# Patient Record
Sex: Female | Born: 1955 | Race: White | Hispanic: No | Marital: Married | Smoking: Never smoker
Health system: Southern US, Community
[De-identification: ages and names within clinical notes are randomized; demographics above are authoritative.]

## PROBLEM LIST (undated history)

## (undated) DIAGNOSIS — D680 Von Willebrand's disease: Secondary | ICD-10-CM

## (undated) DIAGNOSIS — E119 Type 2 diabetes mellitus without complications: Secondary | ICD-10-CM

## (undated) DIAGNOSIS — N289 Disorder of kidney and ureter, unspecified: Secondary | ICD-10-CM

## (undated) DIAGNOSIS — I10 Essential (primary) hypertension: Secondary | ICD-10-CM

## (undated) DIAGNOSIS — C50919 Malignant neoplasm of unspecified site of unspecified female breast: Secondary | ICD-10-CM

## (undated) DIAGNOSIS — C801 Malignant (primary) neoplasm, unspecified: Secondary | ICD-10-CM

## (undated) DIAGNOSIS — H409 Unspecified glaucoma: Secondary | ICD-10-CM

## (undated) DIAGNOSIS — D68020 Von Willebrand disease, type 2a: Secondary | ICD-10-CM

## (undated) HISTORY — PX: OOPHORECTOMY: SHX86

---

## 2004-02-03 ENCOUNTER — Ambulatory Visit: Payer: Self-pay | Admitting: Family Medicine

## 2005-03-21 ENCOUNTER — Emergency Department: Payer: Self-pay | Admitting: Emergency Medicine

## 2005-04-15 ENCOUNTER — Ambulatory Visit: Payer: Self-pay | Admitting: Family Medicine

## 2006-08-30 ENCOUNTER — Ambulatory Visit: Payer: Self-pay | Admitting: Family Medicine

## 2008-04-24 ENCOUNTER — Ambulatory Visit: Payer: Self-pay | Admitting: Oncology

## 2008-05-22 ENCOUNTER — Ambulatory Visit: Payer: Self-pay | Admitting: Oncology

## 2008-05-24 ENCOUNTER — Ambulatory Visit: Payer: Self-pay | Admitting: Oncology

## 2008-05-27 ENCOUNTER — Ambulatory Visit: Payer: Self-pay | Admitting: Oncology

## 2008-06-24 ENCOUNTER — Ambulatory Visit: Payer: Self-pay | Admitting: Oncology

## 2008-07-02 ENCOUNTER — Ambulatory Visit: Payer: Self-pay | Admitting: Obstetrics and Gynecology

## 2008-07-07 ENCOUNTER — Ambulatory Visit: Payer: Self-pay | Admitting: Obstetrics and Gynecology

## 2008-07-24 ENCOUNTER — Ambulatory Visit: Payer: Self-pay | Admitting: Oncology

## 2008-08-05 ENCOUNTER — Ambulatory Visit: Payer: Self-pay | Admitting: Gastroenterology

## 2008-11-24 ENCOUNTER — Ambulatory Visit: Payer: Self-pay | Admitting: Oncology

## 2008-12-01 ENCOUNTER — Ambulatory Visit: Payer: Self-pay | Admitting: Gastroenterology

## 2008-12-16 ENCOUNTER — Ambulatory Visit: Payer: Self-pay | Admitting: Surgery

## 2008-12-17 ENCOUNTER — Ambulatory Visit: Payer: Self-pay | Admitting: Oncology

## 2008-12-22 ENCOUNTER — Ambulatory Visit: Payer: Self-pay | Admitting: Surgery

## 2008-12-24 ENCOUNTER — Ambulatory Visit: Payer: Self-pay | Admitting: Oncology

## 2009-10-02 ENCOUNTER — Ambulatory Visit: Payer: Self-pay | Admitting: Family Medicine

## 2011-03-15 ENCOUNTER — Ambulatory Visit: Payer: Self-pay | Admitting: Family Medicine

## 2011-05-25 ENCOUNTER — Ambulatory Visit: Payer: Self-pay | Admitting: Internal Medicine

## 2011-06-17 ENCOUNTER — Inpatient Hospital Stay: Payer: Self-pay | Admitting: Student

## 2011-06-17 LAB — CK TOTAL AND CKMB (NOT AT ARMC)
CK, Total: 30 U/L (ref 21–215)
CK-MB: <0.5 ng/mL — ABNORMAL LOW (ref 0.5–3.6)

## 2011-06-17 LAB — HEPATIC FUNCTION PANEL A (ARMC)
Albumin: 3.2 g/dL — ABNORMAL LOW (ref 3.4–5.0)
Alkaline Phosphatase: 76 U/L (ref 50–136)
Bilirubin, Direct: <0.1 mg/dL (ref 0.00–0.20)
Bilirubin,Total: 0.3 mg/dL (ref 0.2–1.0)

## 2011-06-17 LAB — IRON AND TIBC
Iron Bind.Cap.(Total): 463 ug/dL — ABNORMAL HIGH (ref 250–450)
Iron Saturation: 10 %
Iron: 48 ug/dL — ABNORMAL LOW (ref 50–170)
Unbound Iron-Bind.Cap.: 415 ug/dL

## 2011-06-17 LAB — TSH: Thyroid Stimulating Horm: 2.33 u[IU]/mL

## 2011-06-17 LAB — CBC
HCT: 22.7 % — ABNORMAL LOW (ref 35.0–47.0)
MCH: 22.7 pg — ABNORMAL LOW (ref 26.0–34.0)
MCHC: 29.4 g/dL — ABNORMAL LOW (ref 32.0–36.0)
MCV: 77 fL — ABNORMAL LOW (ref 80–100)
Platelet: 163 10*3/uL (ref 150–440)
RBC: 2.94 10*6/uL — ABNORMAL LOW (ref 3.80–5.20)
RDW: 17.5 % — ABNORMAL HIGH (ref 11.5–14.5)
WBC: 13.4 10*3/uL — ABNORMAL HIGH (ref 3.6–11.0)

## 2011-06-17 LAB — URINALYSIS, COMPLETE
Bacteria: NONE SEEN
Blood: NEGATIVE
Glucose,UR: NEGATIVE mg/dL (ref 0–75)
Ketone: NEGATIVE
Leukocyte Esterase: NEGATIVE
Protein: NEGATIVE
RBC,UR: NONE SEEN /HPF (ref 0–5)
Specific Gravity: 1.019 (ref 1.003–1.030)
WBC UR: NONE SEEN /HPF (ref 0–5)

## 2011-06-17 LAB — BASIC METABOLIC PANEL
Anion Gap: 10 (ref 7–16)
BUN: 16 mg/dL (ref 7–18)
Calcium, Total: 8.5 mg/dL (ref 8.5–10.1)
Creatinine: 0.82 mg/dL (ref 0.60–1.30)
Glucose: 210 mg/dL — ABNORMAL HIGH (ref 65–99)

## 2011-06-17 LAB — TROPONIN I
Troponin-I: <0.02 ng/mL
Troponin-I: <0.02 ng/mL

## 2011-06-17 LAB — APTT: Activated PTT: 32 secs (ref 23.6–35.9)

## 2011-06-17 LAB — PLATELET FUNCTION ASSAY: COL/EPI PLT FXN SCRN: >300 Seconds — ABNORMAL HIGH

## 2011-06-17 LAB — FERRITIN: Ferritin (ARMC): 4 ng/mL — ABNORMAL LOW (ref 8–388)

## 2011-06-18 LAB — CBC WITH DIFFERENTIAL/PLATELET
Basophil %: 1.2 %
Eosinophil #: 0.1 10*3/uL (ref 0.0–0.7)
Eosinophil %: 1.1 %
HCT: 25.6 % — ABNORMAL LOW (ref 35.0–47.0)
HGB: 7.7 g/dL — ABNORMAL LOW (ref 12.0–16.0)
Lymphocyte %: 20.3 %
MCH: 23.9 pg — ABNORMAL LOW (ref 26.0–34.0)
MCHC: 30.2 g/dL — ABNORMAL LOW (ref 32.0–36.0)
MCV: 79 fL — ABNORMAL LOW (ref 80–100)
Monocyte #: 0.7 x10 3/mm (ref 0.2–0.9)
Monocyte %: 5.1 %
Neutrophil #: 9.6 10*3/uL — ABNORMAL HIGH (ref 1.4–6.5)
Neutrophil %: 72.3 %
RBC: 3.23 10*6/uL — ABNORMAL LOW (ref 3.80–5.20)
RDW: 17.8 % — ABNORMAL HIGH (ref 11.5–14.5)

## 2011-06-18 LAB — TROPONIN I: Troponin-I: <0.02 ng/mL

## 2011-06-18 LAB — BASIC METABOLIC PANEL
Anion Gap: 11 (ref 7–16)
Calcium, Total: 7.9 mg/dL — ABNORMAL LOW (ref 8.5–10.1)
Co2: 25 mmol/L (ref 21–32)
EGFR (Non-African Amer.): >60
Osmolality: 281 (ref 275–301)
Potassium: 3.7 mmol/L (ref 3.5–5.1)
Sodium: 141 mmol/L (ref 136–145)

## 2011-06-18 LAB — PLATELET COUNT: Platelet: 109 10*3/uL — ABNORMAL LOW (ref 150–440)

## 2011-06-18 LAB — HEMOGLOBIN A1C: Hemoglobin A1C: 7.2 % — ABNORMAL HIGH (ref 4.2–6.3)

## 2011-06-18 LAB — CK TOTAL AND CKMB (NOT AT ARMC): CK-MB: <0.5 ng/mL — ABNORMAL LOW (ref 0.5–3.6)

## 2011-06-18 LAB — HEMOGLOBIN: HGB: 7.9 g/dL — ABNORMAL LOW (ref 12.0–16.0)

## 2011-06-19 LAB — CBC WITH DIFFERENTIAL/PLATELET
Basophil #: 0.1 10*3/uL (ref 0.0–0.1)
Basophil #: 0.1 10*3/uL (ref 0.0–0.1)
Basophil %: 0.6 %
Basophil %: 0.7 %
Eosinophil #: 0.2 10*3/uL (ref 0.0–0.7)
Eosinophil #: 0.2 10*3/uL (ref 0.0–0.7)
Eosinophil %: 1.7 %
HCT: 26.1 % — ABNORMAL LOW (ref 35.0–47.0)
HGB: 7.2 g/dL — ABNORMAL LOW (ref 12.0–16.0)
Lymphocyte #: 2.1 10*3/uL (ref 1.0–3.6)
Lymphocyte #: 2.5 10*3/uL (ref 1.0–3.6)
Lymphocyte %: 20.8 %
MCH: 25 pg — ABNORMAL LOW (ref 26.0–34.0)
MCH: 25.2 pg — ABNORMAL LOW (ref 26.0–34.0)
MCHC: 32 g/dL (ref 32.0–36.0)
MCHC: 31.5 g/dL — ABNORMAL LOW (ref 32.0–36.0)
MCV: 78 fL — ABNORMAL LOW (ref 80–100)
MCV: 80 fL (ref 80–100)
Monocyte #: 0.5 x10 3/mm (ref 0.2–0.9)
Monocyte %: 5.2 %
Monocyte %: 5.6 %
Neutrophil #: 7.2 10*3/uL — ABNORMAL HIGH (ref 1.4–6.5)
Neutrophil #: 8.6 10*3/uL — ABNORMAL HIGH (ref 1.4–6.5)
Neutrophil %: 71.1 %
Platelet: 115 10*3/uL — ABNORMAL LOW (ref 150–440)
Platelet: 103 10*3/uL — ABNORMAL LOW (ref 150–440)
RBC: 2.9 10*6/uL — ABNORMAL LOW (ref 3.80–5.20)
RBC: 3.27 10*6/uL — ABNORMAL LOW (ref 3.80–5.20)
RDW: 17.9 % — ABNORMAL HIGH (ref 11.5–14.5)
RDW: 17.8 % — ABNORMAL HIGH (ref 11.5–14.5)
WBC: 10.1 10*3/uL (ref 3.6–11.0)
WBC: 12 10*3/uL — ABNORMAL HIGH (ref 3.6–11.0)

## 2011-06-20 LAB — CBC WITH DIFFERENTIAL/PLATELET
Basophil #: 0.1 10*3/uL (ref 0.0–0.1)
Basophil %: 0.9 %
Basophil %: 0.8 %
Eosinophil #: 0.2 10*3/uL (ref 0.0–0.7)
HCT: 24.3 % — ABNORMAL LOW (ref 35.0–47.0)
HGB: 7.6 g/dL — ABNORMAL LOW (ref 12.0–16.0)
HGB: 7.6 g/dL — ABNORMAL LOW (ref 12.0–16.0)
Lymphocyte #: 2.2 10*3/uL (ref 1.0–3.6)
Lymphocyte #: 2.4 10*3/uL (ref 1.0–3.6)
Lymphocyte %: 18.8 %
Lymphocyte %: 23.9 %
MCH: 25 pg — ABNORMAL LOW (ref 26.0–34.0)
MCHC: 31.9 g/dL — ABNORMAL LOW (ref 32.0–36.0)
MCV: 80 fL (ref 80–100)
MCV: 80 fL (ref 80–100)
Monocyte #: 0.6 x10 3/mm (ref 0.2–0.9)
Monocyte %: 4.9 %
Monocyte %: 4.2 %
Neutrophil #: 8.6 10*3/uL — ABNORMAL HIGH (ref 1.4–6.5)
Neutrophil %: 73.4 %
Neutrophil %: 69.1 %
RBC: 2.96 10*6/uL — ABNORMAL LOW (ref 3.80–5.20)
RDW: 18.2 % — ABNORMAL HIGH (ref 11.5–14.5)
RDW: 18.3 % — ABNORMAL HIGH (ref 11.5–14.5)

## 2011-06-20 LAB — COMPREHENSIVE METABOLIC PANEL
Alkaline Phosphatase: 58 U/L (ref 50–136)
Anion Gap: 9 (ref 7–16)
BUN: 8 mg/dL (ref 7–18)
Bilirubin,Total: 0.5 mg/dL (ref 0.2–1.0)
Creatinine: 0.58 mg/dL — ABNORMAL LOW (ref 0.60–1.30)
EGFR (African American): >60
EGFR (Non-African Amer.): >60
Osmolality: 281 (ref 275–301)
SGOT(AST): 22 U/L (ref 15–37)

## 2011-06-21 LAB — HEMOGLOBIN
HGB: 8.3 g/dL — ABNORMAL LOW (ref 12.0–16.0)
HGB: 8.5 g/dL — ABNORMAL LOW (ref 12.0–16.0)

## 2011-06-21 LAB — CBC WITH DIFFERENTIAL/PLATELET
Eosinophil #: 0.2 10*3/uL (ref 0.0–0.7)
HCT: 26.8 % — ABNORMAL LOW (ref 35.0–47.0)
HGB: 8.6 g/dL — ABNORMAL LOW (ref 12.0–16.0)
Lymphocyte #: 2.2 10*3/uL (ref 1.0–3.6)
Lymphocyte %: 20.9 %
MCHC: 31.9 g/dL — ABNORMAL LOW (ref 32.0–36.0)
MCV: 81 fL (ref 80–100)
Monocyte #: 0.6 x10 3/mm (ref 0.2–0.9)
Monocyte %: 5.6 %
Neutrophil #: 7.2 10*3/uL — ABNORMAL HIGH (ref 1.4–6.5)
Neutrophil %: 70 %
Platelet: 108 10*3/uL — ABNORMAL LOW (ref 150–440)
RBC: 3.31 10*6/uL — ABNORMAL LOW (ref 3.80–5.20)
RDW: 18.6 % — ABNORMAL HIGH (ref 11.5–14.5)
WBC: 10.3 10*3/uL (ref 3.6–11.0)

## 2011-06-21 LAB — BASIC METABOLIC PANEL
Anion Gap: 9 (ref 7–16)
BUN: 5 mg/dL — ABNORMAL LOW (ref 7–18)
Calcium, Total: 8 mg/dL — ABNORMAL LOW (ref 8.5–10.1)
Chloride: 106 mmol/L (ref 98–107)
Co2: 26 mmol/L (ref 21–32)
EGFR (Non-African Amer.): >60
Glucose: 172 mg/dL — ABNORMAL HIGH (ref 65–99)
Osmolality: 283 (ref 275–301)
Potassium: 3.5 mmol/L (ref 3.5–5.1)
Sodium: 141 mmol/L (ref 136–145)

## 2011-06-21 LAB — PLATELET COUNT: Platelet: 110 10*3/uL — ABNORMAL LOW (ref 150–440)

## 2011-06-23 LAB — PATHOLOGY REPORT

## 2011-06-25 ENCOUNTER — Ambulatory Visit: Payer: Self-pay | Admitting: Internal Medicine

## 2012-04-04 ENCOUNTER — Emergency Department: Payer: Self-pay | Admitting: Emergency Medicine

## 2012-04-04 LAB — COMPREHENSIVE METABOLIC PANEL
Albumin: 3.6 g/dL (ref 3.4–5.0)
Alkaline Phosphatase: 98 U/L (ref 50–136)
Anion Gap: 9 (ref 7–16)
BUN: 14 mg/dL (ref 7–18)
Bilirubin,Total: 0.4 mg/dL (ref 0.2–1.0)
Calcium, Total: 9 mg/dL (ref 8.5–10.1)
Co2: 25 mmol/L (ref 21–32)
Creatinine: 0.72 mg/dL (ref 0.60–1.30)
EGFR (Non-African Amer.): >60
Glucose: 237 mg/dL — ABNORMAL HIGH (ref 65–99)
Osmolality: 282 (ref 275–301)
SGOT(AST): 75 U/L — ABNORMAL HIGH (ref 15–37)
Total Protein: 8.3 g/dL — ABNORMAL HIGH (ref 6.4–8.2)

## 2012-04-04 LAB — CBC WITH DIFFERENTIAL/PLATELET
Basophil #: 0.1 10*3/uL (ref 0.0–0.1)
Eosinophil #: 0.1 10*3/uL (ref 0.0–0.7)
HGB: 14.6 g/dL (ref 12.0–16.0)
Lymphocyte #: 1.5 10*3/uL (ref 1.0–3.6)
MCH: 29.1 pg (ref 26.0–34.0)
MCHC: 32.8 g/dL (ref 32.0–36.0)
Monocyte %: 3 %
RDW: 14.7 % — ABNORMAL HIGH (ref 11.5–14.5)

## 2012-05-07 ENCOUNTER — Ambulatory Visit: Payer: Self-pay | Admitting: Family Medicine

## 2013-03-01 ENCOUNTER — Ambulatory Visit: Payer: Self-pay | Admitting: Otolaryngology

## 2013-05-31 ENCOUNTER — Ambulatory Visit: Payer: Self-pay | Admitting: Family Medicine

## 2013-06-13 ENCOUNTER — Ambulatory Visit: Payer: Self-pay | Admitting: Family Medicine

## 2013-10-16 ENCOUNTER — Emergency Department: Payer: Self-pay | Admitting: Emergency Medicine

## 2013-10-16 LAB — TROPONIN I

## 2013-10-16 LAB — BASIC METABOLIC PANEL
ANION GAP: 8 (ref 7–16)
BUN: 22 mg/dL — AB (ref 7–18)
CHLORIDE: 104 mmol/L (ref 98–107)
CREATININE: 0.98 mg/dL (ref 0.60–1.30)
Calcium, Total: 9.1 mg/dL (ref 8.5–10.1)
Co2: 22 mmol/L (ref 21–32)
EGFR (African American): >60
EGFR (Non-African Amer.): >60
GLUCOSE: 441 mg/dL — AB (ref 65–99)
Osmolality: 291 (ref 275–301)
POTASSIUM: 4.1 mmol/L (ref 3.5–5.1)
Sodium: 134 mmol/L — ABNORMAL LOW (ref 136–145)

## 2013-10-16 LAB — CBC
HCT: 42.1 % (ref 35.0–47.0)
HGB: 13.4 g/dL (ref 12.0–16.0)
MCH: 27.7 pg (ref 26.0–34.0)
MCHC: 31.8 g/dL — ABNORMAL LOW (ref 32.0–36.0)
MCV: 87 fL (ref 80–100)
PLATELETS: 167 10*3/uL (ref 150–440)
RBC: 4.83 10*6/uL (ref 3.80–5.20)
RDW: 17.1 % — AB (ref 11.5–14.5)
WBC: 10.2 10*3/uL (ref 3.6–11.0)

## 2013-10-16 LAB — D-DIMER(ARMC): D-Dimer: 447 ng/ml

## 2014-05-18 NOTE — Consult Note (Signed)
Pt had capsule study today.  To be downloaded into computer and i will get a chance to read it tomorrow morning.  Started full liquid diet now.  Electronic Signatures: Manya Silvas (MD)  (Signed on 28-May-13 17:43)  Authored  Last Updated: 28-May-13 17:43 by Manya Silvas (MD)

## 2014-05-18 NOTE — Consult Note (Signed)
Pt colonoscopy done to terminal ileum, there was blood throughout, indicating a source proximal to terminal ileum.  Case discussed with Dr. Cynda Acres. Will give 2 qts Miralax tonight and to have capsule endo tomorrow.  She may be transfered to [facility] due to VWD per Dr. Cynda Acres.   Electronic Signatures: Manya Silvas (MD)  (Signed on 27-May-13 13:27)  Authored  Last Updated: 27-May-13 13:27 by Manya Silvas (MD)

## 2014-05-18 NOTE — Consult Note (Signed)
Chief Complaint:   Subjective/Chief Complaint Feels better after blood transfusion. Looks less pale.   VITAL SIGNS/ANCILLARY NOTES: **Vital Signs.:   25-May-13 09:55   Vital Signs Type Q 4hr   Systolic BP Systolic BP 197   Diastolic BP (mmHg) Diastolic BP (mmHg) 74   Mean BP 87   BP Source standing   Brief Assessment:   Cardiac Regular    Respiratory clear BS    Gastrointestinal Normal   Routine Hem:  25-May-13 09:34    Hemoglobin (CBC) 7.5   Assessment/Plan:  Assessment/Plan:   Assessment vWD with anemia. No active bleeding right now.    Plan Await hematology input. NPO after MN except meds in case we do EGD tomorrow. Thanks.   Electronic Signatures: Verdie Shire (MD)  (Signed [phone] 11:03)  Authored: Chief Complaint, VITAL SIGNS/ANCILLARY NOTES, Brief Assessment, Lab Results, Assessment/Plan   Last Updated: 25-May-13 11:03 by Verdie Shire (MD)

## 2014-05-18 NOTE — Consult Note (Signed)
Chief Complaint:   Subjective/Chief Complaint NO ACUTE COMPLAINTS.  blood in bm this am before colonoscopoy, no bm since   VITAL SIGNS/ANCILLARY NOTES: **Vital Signs.:   27-May-13 22:21   Vital Signs Type Routine   Temperature Temperature (F) 98.1   Celsius 36.7   Temperature Source oral   Pulse Pulse 57   Pulse source per Dinamap   Respirations Respirations 18   Systolic BP Systolic BP 93   Diastolic BP (mmHg) Diastolic BP (mmHg) 66   Mean BP 75   BP Source Dinamap   Pulse Ox % Pulse Ox % 99   Pulse Ox Activity Level  At rest   Oxygen Delivery Room Air/ 21 %   Brief Assessment:   Cardiac Regular    Additional Physical Exam ALERT AND COOPERATIVE, SLIGHT PALLOR, NO DISTRESS, NEURO NON FOCAL, ABDO NON TENDER, PIC LINE SITE NON TENDER, NO OOZING   Routine Hem:  27-May-13 16:58    WBC (CBC) 10.0   RBC (CBC) 2.96   Hemoglobin (CBC) 7.6   Hematocrit (CBC) 23.8   Platelet Count (CBC) 114   MCV 80   MCH 25.7   MCHC 31.9   RDW 18.3  Routine Chem:  27-May-13 16:58    Glucose, Serum 95   BUN 8   Creatinine (comp) 0.58   Sodium, Serum 142   Potassium, Serum 3.7   Chloride, Serum 105   CO2, Serum 28   Calcium (Total), Serum 8.1   Anion Gap 9   Osmolality (calc) 281   eGFR (African American) >60   eGFR (Non-African American) >60  Hepatic:  27-May-13 16:58    Bilirubin, Total 0.5   Alkaline Phosphatase 58   SGPT (ALT) 15   SGOT (AST) 22   Total Protein, Serum 6.1   Albumin, Serum 2.7  Routine Hem:  27-May-13 16:58    Neutrophil % 69.1   Lymphocyte % 23.9   Monocyte % 4.2   Eosinophil % 2.0   Basophil % 0.8   Neutrophil # 6.9   Lymphocyte # 2.4   Monocyte # 0.4   Eosinophil # 0.2   Basophil # 0.1    22:59    Hemoglobin (CBC) 8.4   Radiology Results: XRay:    27-May-13 16:24, Chest Portable Single View   Chest Portable Single View    REASON FOR EXAM:    to check placement of PICC line  COMMENTS:       PROCEDURE: DXR - DXR PORTABLE CHEST SINGLE VIEW  -  Jun 20 2011  4:24PM     RESULT: Comparison is made to the prior exam of 06/17/2011.     The lung fields are clear. No pneumonia, pneumothorax or pleural effusion   is seen. The heart size is normal. A PICC line is present with the tip   projected over the upper portion of the right atrium. No pneumothorax or   pleural effusion is seen.    IMPRESSION:     1.  No acute changes are identified.  2.  A PICC line is present with the tip projected over the upper portion   of the right atrium.      Dictation Site: 2     Thank you for this opportunity to contribute to the care of your patient.         Verified By: Dionne Ano WALL, M.D., MD  Cardiology:    25-May-13 12:48, Echo Doppler   Echo Doppler    Interpretation Summary  The left ventricle is grossly normal size. There is no thrombus. Left   ventricular systolic function is normal. Ejection Fraction = >55%.   There is normal left ventricular wall thickness. The left   ventricular wall motion is normal. The right ventricular systolic   function is normal.    PatientHeight: 168 cm    PatientWeight: 95 kg    BSA: 2.0 m2    Procedure:    A two-dimensional transthoracic echocardiogram with color flow and   Doppler was performed.    The study was completed  bedside.    Left Ventricle    Left ventricular systolic function is normal.    Ejection Fraction = >55%.    The left ventricular wall motion is normal.    The left ventricle is grossly normal size.    There is no thrombus.    There isnormal left ventricular wall thickness.    Right Ventricle    The right ventricle is grossly normal size.    There is normal right ventricular wall thickness.    The right ventricular systolic function is normal.    Atria    The left atrial size is normal.    Right atrial size is normal.    Mitral Valve    The mitral valve leaflets appear thickened, but open well.    There is no mitral regurgitation noted.    Tricuspid Valve    The  tricuspid valve is not well visualized, but is grossly normal.  There is trace tricuspid regurgitation.    Aortic Valve    The aortic valve is normal in structure and function.    No aortic regurgitation is present.    Pulmonic Valve    The pulmonic valve is not well seen, but is grossly normal.    There is no pulmonic valvular regurgitation.    Great Vessels    The aortic root is not well visualized but is probably normal size.    Pericardium/Pleural    There is no pleural effusion.    No pericardial effusion.    MMode 2D Measurements and Calculations    IVSd: 1.1 cm    LVIDd: 5.2 cm    LVIDs: 3.2 cm    LVPWd: 1.2 cm    FS: 40 %    EF(Teich): 70 %    Ao root diam: 3.3 cm    ACS: 2.0 cm    LA dimension: 4.4 cm    LVOT diam: 1.9 cm    LVLd ap4: 9.3 cm    EDV(MOD-sp4): 137 ml    LVLs ap4: 7.5 cm    ESV(MOD-sp4): 57 ml    EF(MOD-sp4): 58 %    SV(MOD-sp4): 80 ml    Doppler Measurements and Calculations    MV E point: 123 cm/sec    MV A point: 144 cm/sec    MV E/A: 0.85     MV P1/2t max vel: 121 cm/sec    MV P1/2t: 78 msec    MVA(P1/2t): 2.8 cm2    MV dec slope: 454 cm/sec2    MV dec time: 0.25 sec    Ao V2 max: 224 cm/sec    Ao max PG: 21 mmHg    Ao V2 mean: 113 cm/sec    Ao mean PG: 6.3 mmHg    Ao V2 VTI: 41 cm    AVA(I,D): 2.2 cm2    AVA(V,D): 1.9 cm2    LV max PG: 9.0 mmHg    LV mean PG: 4.0 mmHg  LV V1 max: 153 cm/sec    LV V1 mean: 91 cm/sec    LV V1 VTI: 31 cm    SV(LVOT): 89 ml    PA V2 max: 133 cm/sec    PA max PG: 7.0 mmHg    PA acc time: 0.11 sec    PI end-d vel: 46 cm/sec    TR Max vel: 241 cm/sec    TR Max PG: 24 mmHg    RVSP: 29 mmHg    RAP systole: 5.0 mmHg    PA pr(Accel): 31 mmHg    Reading Physician: Lujean Amel   Sonographer: Jaci Standard  Interpreting Physician:  Lujean Amel,  electronically signed on   06-19-2011 18:25:49  Requesting Physician: Bartholome Bill   Assessment/Plan:  Invasive Device Daily Assessment of  Necessity:   Does the patient currently have any of the following indwelling devices? central line catheter    Central Line continued, requirement due to    Reason to continue [employer] for frequent venous blood sampling   Assessment/Plan:   Assessment VWD. RESULTS FROM ADMISSION  VIII C  NORMAL,  VIII AG 67 %,  VON WILLEBRANDS ACTIVITY 50 %. NOTE PRIOR REPORTED PLT FUNCTION ABNORMAL  NOTE FROM OLDR RESULTS,  VON WILLEBRANDS ACTIVITY AS LOW AS 22% AND VIII AG AS LOW AS 28%. MILD THROMBOCYTOPENIA MAY BE DUE TO TYPE 2A VWD, OR CONSUMPTION/DILUTION. MILD LEUKOCYTOSIS ATTRIBUTE TO BLEED/STRESS  EVENTS OF TODAY...BLEED THIS AM. COLONIOSCOOPY DONE, SEE REPORT, COLON FULL OF BLOOD,  NO PATHOLOGY COLON. HGB 7.6. NO IV ACCESS FOR PHLEBOTOMY, COULD NOT OBTAIN LABS. IV FOR FLUIDS STILL RUNNING BUT TENUOUS ACCESS. NEED FOR RELIABLE ACCESS, WANTED TO AVOID BLIND STICK, TRIPLE LUMEN CATH. VWD STUDIES CAN BE SENT EARLIEST TOMORROW AM, AND THEN 24 HRS TURNAROUND TIME. PLT FUNCTION STUDIES UNAVAILABLE DUE TO CLUMPING, MAY INDICATE SUCCESSFULL CORRECTION OF DEFICIT IN VIVO    Plan 1. DISCUSSED WITH GI, MEDICINE,  AND [facility] CONSULTANT DR MOLL  2. WILL GET CAMERA STUDY. 3. WILL CONTINUE EMPIRIC TX, INCREASING DOSING TO Q8H AND 60 U/KG HUMATE P  4. WILL TRANSFER WHEN AVAILABLE TO [facility], WITH [facility] HAVING BETTER ACCESS TO COAG TESTING AND TURNAROUND TIME TO OPTIMALLY DOSE  5. DISCUSSD IV ACCESS WITH DR DEW VASCULAR SURGERY, HAD PIC LINE INSERTED BY ON VASCULAR SERVICE. 6. TRANSFUSED ONE UNIT PRBC WITH HGB LESS THAN 8G AND GI BLEED, DISCUSSED RISKS OF BLOOD INCLUDING INFECTION AND RTRANSFUSION RX PATIENT UNDERSTANDS AND CONSENTS. 7. TELEMETRY, AND FREQUENT VITAL SIGNS   Electronic Signatures: Dallas Schimke (MD)  (Signed 27-May-13 23:25)  Authored: Chief Complaint, VITAL SIGNS/ANCILLARY NOTES, Brief Assessment, Lab Results, Radiology Results, Assessment/Plan   Last Updated: 27-May-13 23:25 by  Dallas Schimke (MD)

## 2014-05-18 NOTE — Consult Note (Signed)
Chief Complaint:   Subjective/Chief Complaint NO ACUTE COMPLAINTS.  NO BM SINCE YESTERDAY AFTERNOON, DID HAVE A DARK STOOL YESTERDAY AFTERNOON   VITAL SIGNS/ANCILLARY NOTES: **Vital Signs.:   26-May-13 11:55   Vital Signs Type 15 min Post Blood Start Time   Temperature Temperature (F) 98.3   Celsius 36.8   Temperature Source oral   Pulse Pulse 30   Pulse source per Dinamap   Respirations Respirations 20   Systolic BP Systolic BP 659   Diastolic BP (mmHg) Diastolic BP (mmHg) 75   Mean BP 89   BP Source Dinamap   Pulse Ox % Pulse Ox % 98   Oxygen Delivery Room Air/ 21 %   Brief Assessment:   Cardiac Regular    Gastrointestinal details normal Nontender    Additional Physical Exam ALERT AND COOPERATIVE, SLIGHT PALLOR, NO DISTRESS, NEURO NON FOCAL   Routine Hem:  26-May-13 06:34    WBC (CBC) 10.1   RBC (CBC) 2.90   Hemoglobin (CBC) 7.2   Hematocrit (CBC) 22.6   Platelet Count (CBC) 115   MCV 78   MCH 25.0   MCHC 32.0   RDW 17.9   Neutrophil % 71.1   Lymphocyte % 21.2   Monocyte % 5.2   Eosinophil % 1.9   Basophil % 0.6   Neutrophil # 7.2   Lymphocyte # 2.1   Monocyte # 0.5   Eosinophil # 0.2   Basophil # 0.1  Blood Glucose:  26-May-13 07:40    POCT Blood Glucose 163    11:32    POCT Blood Glucose 150   Assessment/Plan:  Assessment/Plan:   Assessment VWD. RESULTS FROM ADMISSION  VIII C  NORMAL,  VIII AG 67 %,  VON WILLEBRANDS ACTIVITY 50 %. NOTE PRIOR REPORTED PLT FUNCTION ABNORMAL  NOTE FROM OLDR RESULTS,  VON WILLEBRANDS ACTIVITY AS LOW AS 22% AND VIII AG AS LOW AS 28%. MAY BE DUE TO TYPE 2A VWD, OR CONSUMPTION/DILUTION.  THERE WAS A MELENA STOOL YESTERDAY, I WAS NOT NOTIFIED. NO RECURRENT STOOLS. THIS AM HGB ONLY SLIGHTLY DOWN. NOW EGD NEGATIVE, A POLYP WAS BX    Plan THIS AM PRE PROCEEDURE HUMATE P,  20U/KG OR 2000 U.  NOW THAT BX DONE, WILL REPEAT DOSE IN 12 HRS. ALSO, SINCE SOME EVIDENCE OF SLOW BLEED, WILL DOSE Q 12 H X 2 DOSES, WHICH IS ONE GUIDELINE FOR  MINOR HEMORRHAGE, THEN IF ANY BX DONE AT COLONOSCOPY, ONE ADDITIONAL DOSE POST OP, IF EVIDENCE OF CONTINUED OR MAJOR BLEED, WOULD GIVE DOSING APPROX Q12H X 4 DAYS THEN DAILY X 3 DAYS. VWD PROFILE TESTING RESULTS WILL NOT BE AVAILABLE OVER NEXT 83 HRS,   WITH F/U VWD PROFILES LATER. WILL RECHECK PLT FUNCTION STUDIES, IF NOT CORRECTED BY FIRST DOSE WILL EMPIRICALLY DOUBLE TO 40U/KG OR 4000 UNITS.  WILL F/U PLTS, MAY NEED OTHER MEASURES IF BELOW 90K   Electronic Signatures: Dallas Schimke (MD)  (Signed 26-May-13 12:30)  Authored: Chief Complaint, VITAL SIGNS/ANCILLARY NOTES, Brief Assessment, Lab Results, Assessment/Plan   Last Updated: 26-May-13 12:30 by Dallas Schimke (MD)

## 2014-05-18 NOTE — Consult Note (Signed)
Pt seen and examined. See Dawn Harrison's notes. 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 Agree with blood transfusion. Request assistance from hematology about blood products, such as cryoprecipate or humate prior to proceeding with EGD and perhaps repeating colonoscopy. Will follow. Thanks   Electronic Signatures: Verdie Shire (MD) (Signed on 24-May-13 19:32)  Authored   Last Updated: 24-May-13 19:34 by Verdie Shire (MD)

## 2014-05-18 NOTE — H&P (Signed)
PATIENT NAME:  [name], [name] MR#:  [id number] DATE OF BIRTH:  1955/03/25  DATE OF ADMISSION:  06/17/2011  REFERRING PHYSICIAN: Dr. Marta Antu.  PRIMARY CARE PHYSICIAN:     CHIEF COMPLAINT: Dizziness.   HISTORY OF PRESENT ILLNESS: The patient is a pleasant 59 year old female with past medical history as listed below, including hypertension, hyperlipidemia, diabetes mellitus, history of von Willebrand's disorder who presents to the Emergency Department with the above-mentioned chief complaint. The patient states that she has had overwhelming dizziness over the past couple of weeks, worse over the past week. Dizziness is worse with exertion and walking. Today, her husband had to go for some blood work and from walking from the car to the building, the patient became extremely dizzy. She also has some dyspnea on exertion and she has been feeling presyncopal. She went to Dr. Barbarann Ehlers office yesterday to get some lab work as she is awaiting a physical and routine follow-up for next week. Dr. Barbarann Ehlers office called the patient today attempting to notify her that her blood work was abnormal. However, the patient came to the ER in the interim due to severe dizziness. In the ER, she was noted to be quite anemic with hemoglobin of 6.7 and she is also hypotensive with blood pressure 90s over 40s. A digital rectal exam was performed by ER physician and the patient was noted to have strongly Hemoccult positive stools. She denies experiencing any abdominal pain, nausea or vomiting or any melena or hematochezia. She has not been taking any NSAIDs on a regular basis though she does take ibuprofen very infrequently. Otherwise, she is without any specific complaints at this time. The patient denies any chest pain. No fever or chills. Hospitalist services were contacted for further evaluation.   PAST SURGICAL HISTORY:  1. Left oophorectomy. 2. Colonoscopy 12/01/2008 revealing multilobulated rectal polyp.   PAST  MEDICAL HISTORY:  1. Hypertension. 2. Hyperlipidemia. 3. Type 2 diabetes mellitus. 4. Hypothyroidism.  5. Von Willebrand disease, possibly type IIB.  6. Childhood bleeding after wisdom tooth extraction.  7. Bleeding complication and hemorrhage after childbirth/cesarean section.  8. She also had a miscarriage.  9. Patient had received cryoprecipitate in 1980 as well as [place].  10. Rectal polyp.  11. History of unspecified gastrointestinal bleed.  12. History of unspecified heart murmur.   ALLERGIES: No known drug allergies.    HOME MEDICATIONS:  1. Glipizide 5 mg p.o. b.i.d.  2. Ketoconazole 2% cream topically applied as needed.  3. Levothyroxine 137 mcg daily. 4. Lisinopril 5 milligrams daily.  5. Metformin 1,0000 milligrams b.i.d.  6. Simvastatin 40 mg daily.   FAMILY HISTORY: Positive for von Willebrand disease in the patient's mother as well as 33 of her nine brothers and her sister. Family history is also positive for diabetes mellitus and unspecified heart disease.   SOCIAL HISTORY: Negative for tobacco, alcohol or illicit drug use. The patient lives at home with her husband who accompanies her today.   REVIEW OF SYSTEMS: CONSTITUTIONAL: Reports fatigue, dizziness, presyncope. Has generalized weakness. Denies any pain. Denies any recent changes in weight. Denies fevers or chills. HEAD/EYES: Denies headache or blurry vision. ENT: Denies tinnitus, earache, nasal discharge, or sore throat. RESPIRATORY: Denies cough or wheezing. Has some dyspnea on exertion. Denies hemoptysis. CARDIOVASCULAR: Denies chest pain, heart palpitations or lower extremity edema. GASTROINTESTINAL: Denies nausea, vomiting, diarrhea, constipation, melena, hematochezia, or abdominal pains. GENITOURINARY: Denies dysuria or hematuria. ENDOCRINE: Denies heat or cold intolerance. HEME/LYMPH: Denies easy bruising or  any bleeding recently. Has history of von Willebrand's disease. INTEGUMENTARY: Denies rash or  lesions. SKIN: Skin has been turning pale. MUSCULOSKELETAL: Denies joint pain or muscle weakness. NEUROLOGIC: Has dizziness and presyncope. Denies numbness, tingling, or dysarthria. Has generalized weakness. PSYCHIATRIC: Denies depression or anxiety.   PHYSICAL EXAMINATION:  VITAL SIGNS: Temperature 97.5, pulse 80, blood pressure 97/47, respirations 20, oxygen saturation 98% on room air.   GENERAL: The patient is alert and oriented. She is not acutely distressed.   HEENT: Normocephalic, atraumatic. Pupils are equal, round and reactive to light and accommodation. Extraocular movements intact. Anicteric sclerae. Bilateral conjunctival pallor. Hearing intact to voice. Nares without drainage. Oral mucous is moist without lesions.   NECK: Supple with full range of motion. No jugular venous distention, lymphadenopathy, or carotid bruits bilaterally. No thyromegaly or tenderness to palpation over thyroid gland.   CHEST: Normal respiratory effort without use of accessory respiratory muscles.   LUNGS: Clear to auscultation bilaterally without crackles, rales, or wheezes.   CARDIOVASCULAR: S1, S2 positive. Regular rate and rhythm. 2/6 systolic ejection murmur without rubs or gallops. PMI is non-lateralized.   ABDOMEN: Soft, nontender, nondistended. Normoactive bowel sounds. No hepatosplenomegaly or palpable masses. No hernia.   RECTAL: Performed by ER physician. The patient was noted to have strongly Hemoccult-positive stools. No melena or bright red blood was noted.   EXTREMITIES: No clubbing, cyanosis, or edema. Pedal pulses are palpable bilaterally.   SKIN: No suspicious rashes. There is generalized skin pallor and subungual pallor. Skin turgor is good.   LYMPH: No cervical lymphadenopathy.   NEUROLOGIC: Alert and oriented x3. Cranial nerves II through XII are grossly intact. No focal deficits.    PSYCH: Pleasant female with appropriate affect.   LABORATORY, RADIOLOGICAL AND DIAGNOSTIC DATA:  CBC: WBC 13.4, hemoglobin 6.7, hematocrit 22.7, platelets 163, MCV 77. BMP within normal limits except for serum glucose elevated at 210. Troponin less than 0.02. CK 27, CK-MB less than 0.5. Chest x-ray PA and lateral: No evidence of pneumonia or pulmonary alveolar edema, minimal problems with superior and hilar lung markings. Noncontrast head CT: No acute intracranial abnormalities are noted.    ASSESSMENT AND PLAN: This is a 59 year old female with past medical history of hypertension, hyperlipidemia, diabetes mellitus type 2, history of von Willebrand disease, here with worsening dizziness, presyncope, and dyspnea on exertion, noted to be anemic with hemoglobin of 6.7 with microcytic component who is hypotensive and noted to have strongly heme positive stools by digital rectal exam.  1. Anemia with heme positive stools that is concerning for possible gastrointestinal bleed.  2. Hypotension, likely combination of anemia plus blood pressure medications.  3. Presyncope due to #1 and #2 above.  4. History of von Willebrand's disease, likely type IIB.  5. History of heart murmur, unspecified.  6. History of type 2 diabetes mellitus. 7. History of hyperlipidemia.  8. History of hypertension.  9. History of hypothyroidism.   PLAN:  1. We will admit the patient to Med/Surg unit with off-unit telemetry.  2. We will start volume resuscitation with IV fluids and keep the patient on IV Protonix for now given concerns for possible gastrointestinal bleed. As the patient is hypotensive, will hold her blood pressure medication for now.  3. In regards to profound anemia with microcytic component, suspect iron deficiency. Will send off an iron panel and ferritin level and will transfuse the patient blood as she is symptomatic and hypotensive and ER physician has written for 2 units of PRBCs and we will  follow hemoglobin and hematocrit closely.  4. Obtain gastroenterology consultation.  5. We will also obtain  hematology consultation given von Willebrand disease and will check coags and von Willebrand profile and platelet diagnostic and screening tests. The patient will be seen in consultation by Dr. Inez Pilgrim.  6. In regards to her heart murmur, will obtain an echocardiogram and monitor the patient on telemetry since she had presyncope, but, as above, suspect presyncope secondary to hypotension and volume loss from anemia and neuro exam is benign and head CT is negative. Therefore, will hold off on carotid Doppler's for now.  7. In regards to diabetes mellitus, continue metformin and glipizide for now as well as sliding scale insulin and check hemoglobin A1c and monitor sugars closely.  8. Continue statin for hyperlipidemia.  9. Continue Synthroid for hypothyroidism and check TSH and adjust accordingly. 10. Deep vein thrombosis prophylaxis with SCDs and TEDs.   CODE STATUS: FULL CODE.   TIME SPENT ON ADMISSION: Approximately 60 minutes.  ____________________________ Romie Jumper, MD knl:ap D: 06/17/2011 15:45:19 ET T: 06/17/2011 16:50:14 ET JOB#: [id number]  cc: Romie Jumper, MD, <Dictator> Irven Easterly. Kary Kos, MD Romie Jumper MD ELECTRONICALLY SIGNED 07/04/2011 19:38

## 2014-05-18 NOTE — Consult Note (Signed)
Chief Complaint:   Subjective/Chief Complaint NO ACUTE COMPLAINTS.  reported one stool earlier am darker, then light/greenish   VITAL SIGNS/ANCILLARY NOTES: **Vital Signs.:   28-May-13 11:15   Vital Signs Type Routine   Temperature Temperature (F) 98.2   Celsius 36.7   Temperature Source oral   Pulse Pulse 65   Pulse source per Dinamap   Respirations Respirations 18   Systolic BP Systolic BP 709   Diastolic BP (mmHg) Diastolic BP (mmHg) 58   Mean BP 73   BP Source vital sign device   Pulse Ox % Pulse Ox % 96   Pulse Ox Activity Level  At rest   Oxygen Delivery Room Air/ 21 %   Brief Assessment:   Cardiac Regular    Additional Physical Exam ALERT AND COOPERATIVE, SLIGHT PALLOR, NO DISTRESS, NEURO NON FOCAL, ABDO NON TENDER, PIC LINE SITE NON TENDER, NO OOZING   Routine Hem:  28-May-13 17:41    Hemoglobin (CBC) 8.3   Platelet Count (CBC) 110   Assessment/Plan:  Invasive Device Daily Assessment of Necessity:   Does the patient currently have any of the following indwelling devices? central line catheter    Central Line continued, requirement due to    Reason to continue [employer] for frequent venous blood sampling   Assessment/Plan:   Assessment VWD. RESULTS FROM ADMISSION  VIII C  NORMAL,  VIII AG 67 %,  VON WILLEBRANDS ACTIVITY 50 %. NOTE PRIOR REPORTED PLT FUNCTION ABNORMAL  NOTE FROM OLDR RESULTS,  VON WILLEBRANDS ACTIVITY AS LOW AS 22% AND VIII AG AS LOW AS 28%. MILD THROMBOCYTOPENIA MAY BE DUE TO TYPE 2A VWD, OR CONSUMPTION/DILUTION. MILD LEUKOCYTOSIS ATTRIBUTE TO BLEED/STRESS  EVENTS OF 5/27...BLEED IN AM. COLONIOSCOOPY DONE, SEE REPORT, COLON FULL OF BLOOD,  NO PATHOLOGY COLON. HGB 7.6. NO IV ACCESS FOR PHLEBOTOMY, COULD NOT OBTAIN LABS.  TENUOUS ACCESS. NEED FOR RELIABLE ACCESS, WANTED TO AVOID BLIND STICK, TRIPLE LUMEN CATH. VWD STUDIES CAN BE SENT EARLIEST 5/28 AM, AND THEN 24 HRS TURNAROUND TIME. PLT FUNCTION STUDIES UNAVAILABLE DUE TO CLUMPING, MAY  INDICATE SUCCESSFULL CORRECTION OF DEFICIT IN VIVO....PIC LINE STARTED. ONE UNIT PRBC GIVEN, HGB UP TO 8.6, DISCUSSED WITH GI. MEDICINE, AND [facility] CONSULTANT    Plan 1.  CAMERA STUDY. 2 WILL CONTINUE EMPIRIC TX, AS NOTED 5/27 HAVE INCREASED DOSING TO Q8H AND 60 U/KG HUMATE P  4. WILL TRANSFER WHEN AVAILABLE TO [facility], WITH [facility] HAVING BETTER ACCESS TO COAG TESTING AND TURNAROUND TIME TO OPTIMALLY DOSE.OTHERWISE, CONSIDERING A MAJOR BLEED, WITH MULTIPLE UNITS BLOOD REQUIRED, WOULD CONTINUE HUMATE P TID X 4 DAYS AND THEN DAILY X 3, OR ADJUST FOR CLINICAL EVENTS  5. ON 5/27 DISCUSSD IV ACCESS WITH DR Lucky Cowboy VASCULAR SURGERY, HAD PIC LINE INSERTED BY  VASCULAR SERVICE. 6. CHECK HGB Q8H, TRANSFUSE PRN   Electronic Signatures: Dallas Schimke (MD)  (Signed 28-May-13 18:24)  Authored: Chief Complaint, VITAL SIGNS/ANCILLARY NOTES, Brief Assessment, Lab Results, Assessment/Plan   Last Updated: 28-May-13 18:24 by Dallas Schimke (MD)

## 2014-05-18 NOTE — Consult Note (Signed)
Continues to melena/dark stool. Hgb down to 7.2 today. Discussed transfusion with patient. Pt agreed. EGD did not show site of bleeding, Min erythema in antrum. Small polyp vs nodule at GE junction. Removed with hot biopsy. Plan blood transfusion today. Plan bowel prep for colonoscopy tomorrow with Dr. Vira Agar tomorrow afternoon. Will discuss with Dr.Gitin about continuing hemate P.umatin.    Electronic Signatures: Verdie Shire (MD) (Signed on 26-May-13 09:37)  Authored   Last Updated: 26-May-13 09:38 by Verdie Shire (MD)

## 2014-05-18 NOTE — Discharge Summary (Signed)
PATIENT NAME:  [name], ALLENSWORTH MR#:  [id number] DATE OF BIRTH:  04/03/1955  DATE OF ADMISSION:  06/17/2011 DATE OF DISCHARGE:  06/20/2011  CONSULTANTS:  1. Barbette Reichmann, MD - Hematology. 2. Gaylyn Cheers, MD / Verdie Shire, MD - Gastroenterology.  CURRENT DIAGNOSES: 1. Anemia with gastrointestinal bleed, likely upper gastrointestinal proximal to terminal ileum in the setting of von Willebrand's disease  2. Severe iron deficiency anemia.  3. History of hypertension with relative hypotension.  4. Presyncope.  5. Thrombocytopenia.  6. History of heart murmur.  7. Diabetes.  8. Hyperlipidemia.  9. Hypothyroidism   CURRENT MEDICATIONS:  1. Humate-P 4000 units every 12 hours, which has been changed to Humate-P 5958 units every 8 hours. 2. Synthroid 137 mcg daily. 3. Protonix 40 mg every 12 hours.  4. Simvastatin 40 mg daily.  5. Sliding scale insulin.  6. Glipizide and metformin have been held.  7. Normal saline 50 mL/h. 8. PRN medications are Tylenol, morphine, and Zofran.   HISTORY OF PRESENT ILLNESS: For full details of the history and physical, please see the history and physical dictated on 06/17/2011 by Dr. Holley Raring, but briefly this is a 59 year old female with a history of hypertension, hyperlipidemia, and von Willebrand's who presented with dizziness. The patient has been feeling dizzy for the past couple of weeks which is worse with exertion. She also had some dyspnea on exertion and feeling presyncopal and had some blood work done with Dr. Barbarann Ehlers office, who is her primary care physician, and was told to go to the ER for abnormal blood counts. Here she was found to be severely anemic with hemoglobin initially of 6.7 and hypotensive with systolic in the 78H over 88F diastolic. She had not complained of any abdominal pain, melena, or hematochezia, nor regular NSAID use. She was admitted to the hospitalist service.   SIGNIFICANT LABS/IMAGING: Initial was BUN 16 and creatinine 0.82.  Iron was 48, TIBC elevated at 463, and ferritin low at 4. Potassium was 4 and sodium 136. LFTs on arrival: Albumin was 3.2, otherwise within normal limits. Troponins were negative x3. Initial WBC was 13.4, hemoglobin 6.7, hematocrit 22.7, and MCV 77. Hemoglobin has been between 6.7 and 8.2 with the last one being 7.6 as of earlier in the morning. INR on arrival was 1. PTT was 32 and PT 13.3.  Urinalysis was not suggestive of infection.   Von Willebrand profile from 06/17/2011 showed factor VIII activity of 117% and von Willebrand's factor antigen 67%. Activity 50%. Initial platelets 163, which has been trending and staying on the lower side, in the low 100s. The last platelets were 116 and prior to that was 103.   HOSPITAL COURSE: Hospital course has been complicated with ongoing severe anemia with bleeding. She was noted to have strongly heme-positive stools initially in the ER. She did have bouts of melena. She underwent EGD as well as colonoscopy. The EGD took place on 06/19/2011. A single polyp was found at the gastroesophageal junction and the polyp was removed with hot biopsy forceps. Examination was otherwise normal besides some gastric mucosal abnormality in the antrum. She tolerated the procedure well. The patient did have a dose of prophylactic Humate-P per hematology recommendations prior to the procedure. The patient did have 2 units of PRBC on 06/18/2011 as well as another unit of PRBC on 06/19/2011. The patient did have ongoing bouts of dizziness at times. Iron studies were sent which showed severe iron deficiency anemia suggesting possible slow bleed prior. The patient underwent  a colonoscopy on 06/20/2011. At the time, the patient has been already started on standing Humate-P injections. The colonoscopy did show significant blood throughout indicating the source proximal to the terminal ileum. She was transfused another unit of PRBC. Peak hemoglobin has been 8.2, on 06/19/2011, at about 9  o'clock. Since then it again has trended down to 7.6 as of earlier this morning. The case has been discussed with oncology at [facility] whom the patient regularly follows up with and she is to be transferred to [facility] for further evaluation and care. The patient is not tachycardic and pressures have remained in the low 100s. She has some mild right lower quadrant tenderness. Otherwise abdominal exam is benign. The syncope likely is secondary to significant anemia. She did have some work-up including a CT of the brain which did not show any acute intracranial abnormalities as well as a 2-D echocardiogram which showed normal ejection fraction of greater than or equal to 55% without thrombus. At this point, she is awaiting for transfer to [facility] pending bed in availability.   CODE STATUS: THE PATIENT IS FULL CODE   TIME SPENT: 35 minutes.  ____________________________ Vivien Presto, MD sa:slb D: 06/20/2011 16:27:20 ET T: 06/20/2011 16:41:58 ET JOB#: [id number]  cc: Vivien Presto, MD, <Dictator> cc:        Jarome Lamas, MD Vivien Presto MD ELECTRONICALLY SIGNED 06/21/2011 10:38

## 2014-05-18 NOTE — Consult Note (Signed)
Chief Complaint:   Subjective/Chief Complaint DID NOT INTERVIEW AGAIN...NO REPORTS OF ACUTE PROBLEMS,  SEE IMP AND PLAN   VITAL SIGNS/ANCILLARY NOTES: **Vital Signs.:   25-May-13 13:54   Vital Signs Type Routine   Temperature Temperature (F) 98.1   Celsius 36.7   Temperature Source oral   Pulse Pulse 76   Respirations Respirations 20   Systolic BP Systolic BP 003   Diastolic BP (mmHg) Diastolic BP (mmHg) 54   Mean BP 77   BP Source Dinamap   Pulse Ox % Pulse Ox % 96   Brief Assessment:   Additional Physical Exam DID NOT REEXAMINE  SEE IMP AND PLAN     Routine Hem:  25-May-13 01:29    WBC (CBC) 13.3   RBC (CBC) 3.23   Hemoglobin (CBC) 7.7   Hematocrit (CBC) 25.6   Platelet Count (CBC) 108   MCV 79   MCH 23.9   MCHC 30.2   RDW 17.8   Neutrophil % 72.3   Lymphocyte % 20.3   Monocyte % 5.1   Eosinophil % 1.1   Basophil % 1.2   Neutrophil # 9.6   Lymphocyte # 2.7   Monocyte # 0.7   Eosinophil # 0.1   Basophil # 0.2    09:34    Hemoglobin (CBC) 7.5    17:01    Hemoglobin (CBC) 7.9   Platelet Count (CBC) 109   Assessment/Plan:  Assessment/Plan:   Assessment VWD. RESULTS FROM ADMISSION AVAILABLE, VIII C  NORMAL,  VIII AG 67 %,  VON WILLEBRANDS ACTIVITY 50 %. NOTE THESE RESULTS WILL VARY, NOTE PRIOR REPORTED PLT FUNCTION ABNORMAL  NOTE FROM OLDR RESULTS,  VON WILLEBRANDS ACTIVITY AS LOW AS 22% AND VIII AG AS LOW AS 28%. ALSO HGB HOLDING, PLTS LOWER, MAY BE DUE TO TYPE 2A VWD, OR CONSUMPTION/DILUTION    Plan WILL GIVE PRE PROCEEDURE HUMATE P,  20U/KG OR 2000 U.  START INFUSION 90 MINS BEFORE PROCEEDURE . IF BX DONE WILL GIVE SECOND DOSE 12 HRS LATER.   IF BLEEDING NOTED AT ENDOSCOPY WILL GIVE DOSES Q12H FOR AS LONG AS 4 DAYS THEN DAILY X 3 DAYS, WITH F/U VWD PROFILES.  WILL F/U PLTS, MAY NEED OTHER MEASURES IF BELOW 90K   Electronic Signatures: Dallas Schimke (MD)  (Signed 26-May-13 12:16)  Authored: Chief Complaint, VITAL SIGNS/ANCILLARY NOTES, Brief Assessment,  Lab Results, Assessment/Plan   Last Updated: 26-May-13 12:16 by Dallas Schimke (MD)

## 2014-05-18 NOTE — Consult Note (Signed)
PATIENT NAME:  [name], [name]#:  [id number] DATE OF BIRTH:  [birth year]  DATE OF CONSULTATION:  06/18/2011  REFERRING PHYSICIAN:   CONSULTING PHYSICIAN:  Simonne Come. Inez Pilgrim, MD  HISTORY OF PRESENT ILLNESS: [name] is a 59 year old patient with history that includes von Willebrand's disease who presented to the Emergency Room with weakness and presyncope. She had dyspnea on exertion. She went to Dr. Barbarann Ehlers office and had labs and was called and told her hemoglobin was down to 6.7. The patient came to the Emergency Room due to dizziness. She noted weakness which she related to me in retrospect she has not felt right since she had the flu in December. She was weaker progressively in January, February, March, and in the last month or two it is more significant. In the last few weeks it's building up. No retrosternal chest pain. No ear or jaw pain. No cough or wheezing. She has not seen any melena or any hematochezia and was not taking nonsteroidal medications which she knows to avoid because of her illness. She did admit to taking occasional ibuprofen. She did not have fever or chills.  ADDITIONAL SYSTEM REVIEW: No visual disturbances. No cough or wheezing. No epistaxis. No gum bleeding. No ear or jaw pain. No edema. No bone pain. No rash, bruising or pruritus. No focal weakness.   PAST MEDICAL HISTORY:  1. Von Willebrand's disease with records of being seen in the [place] most recently May 2010 by Dr. Grayland Ormond. Her previous testing was consistent with type II-A but multiple brothers and sisters have type II-B von Willebrand's disease so it is not determined if she has II-A or II-B. She had had normal platelet counts in the past and she had had a test dose of DDAVP that affected von Willebrand proteins appropriately, did not by history affect the platelets so in all likelihood she has type II-B.  2. History of hemorrhage after wisdom teeth extraction, hemorrhage after childbirth, and she had  had a GI bleed one time in the 1980's and had had cryoprecipitate on a number of occasions years ago as recent as 1990.  3. The patient had surgery on ovary in November 2010 and she received Humate-P preprocedure 25 units/kg one dose. Later in May 2010 she had transanal excision of a villous adenoma and had 40 units/kg Humate-P given prophylactically.  4. The patient also gets on occasion epistaxis but not recently.  5. She also had a colonoscopy in 2010.  6. Hypertension. 7. Hyperlipidemia. 8. Diabetes. 9. Hypothyroidism.   ALLERGIES: No known drug allergies.   MEDICATIONS:  1. Glipizide 5 mg b.i.d. 2. Ketoconazole cream topically. 3. Levothyroxine 137 mcg.  4. Lisinopril 5 mg daily.  5. Metformin 1000 mg b.i.d.  6. Simvastatin 40 mg daily.   FAMILY HISTORY: Multiple family members with von Willebrand's disease. No malignancy.   SOCIAL HISTORY: No alcohol or tobacco.   SYSTEM REVIEW: As above.   PHYSICAL EXAMINATION:   GENERAL: When seen she was alert and cooperative in no distress with slight pallor.   HEENT: Sclerae no jaundice.   MOUTH: No thrush.   NECK: No mass.   LYMPH: No palpable lymph nodes in the neck, supraclavicular, submandibular, or axillae.   LUNGS: Clear. No wheezing, rales, or rhonchi.   HEART: Regular. There was a grade II systolic aortic region murmur.   ABDOMEN: Nontender. No palpable mass or organomegaly.   EXTREMITIES: No extremity edema.  RECTAL: Did not do a rectal exam. Reported to have  strongly positive stools but stools were brown.   SKIN: No edema. No rash.   NEUROLOGIC: Grossly nonfocal and intact. Moving all extremities. I did not test the gait.   LABORATORY, DIAGNOSTIC, AND RADIOLOGICAL DATA: White count 13.4, hemoglobin 6.7, MCV 77, platelets 163, glucose 210, creatinine 0.82. Troponin was low. Liver chemistries were unremarkable. PTT was 32 seconds and a pro-time was 13.3 seconds. Iron was 10% saturated, ferritin 4. TSH 2.33. I  asked for a platelet function testing. They were abnormal over 300 seconds.   Chest x-ray was done that showed minimal prominence of perihilar lung markings.   CT of the head without contrast was unremarkable.   Von Willebrand's profile was sent, was not available result initially. Urinalysis was negative including no blood. The patient had been given 2 units of blood prior to my seeing her and hemoglobin was up to 7.7 and white count was still 13.3, neutrophils 9.6. Creatinine remained normal at 0.64. Serum calcium was minimally low at 7.9. The hemoglobin later in the morning held at 7.5.   IMPRESSION AND PLAN: The patient is with a history of von Willebrand's disease and bleeding episodes in the past and has been treated with prophylactic Humate-P for surgical procedures in the past. Her history is also notable for DDAVP apparently being effective but resulting in the patient's description of feeling very poorly and shaky and flushing and a bad headache and blood pressure very elevated that seemed to be prolonged requiring observation so decision made to use Humate-P, not DDAVP. The patient has a new iron deficiency anemia. She has nonspecific chest x-ray findings. She has a slight leukocytosis that's likely due to reactive marrow. Original evaluation and history no evidence of acute bleed. The patient never had melena. Had not had a bowel movement in over a day and a half when she was admitted and so the bleeding was likely subacute or even chronic. Therefore, not initially immediately treated. She did receive blood and the hemoglobin came up to what is probably an inappropriate level given equilibration effects and fluid effects although less than a full 2 units improvement in the hemoglobin. Von Willebrand's new baseline factor sent off but those results were not available initially. The PTT was normal.    PLAN:  1. Later follow-up with chest x-ray.  2. Later follow-up with CBC with attention to  neutrophil count.  3. Later iron supplementation but initially no indication for IV iron as the patient is going to be given blood and would not give p.o. iron immediately as the patient is likely going to have luminal examinations.  4. Planning to watch for bleeding and if evidence of ongoing bleeding treat with Humate-P. Dose is appropriate for either major or minor bleeding episodes. Initially there was no evidence of active bleeding and was not treated initially. Then plans were formulated for the patient to have an endoscopy so was planned to give prophylactic dose of Humate-P. This would be given empirically at 40 units/kg or if the results are available, which they later were available, could use as a baseline to predict future response. I calculated that initially, patient who had had von Willebrand's factor activity as low as 22% in the past might need a dose of 20 units/kg to bring up to or greater than 60% levels. Note that prior Factor VIII antigen, factor VIII coagulation levels were never seriously or significantly low. The patient was followed with serial CBC watching the hemoglobin and platelets and later as outlined in  follow-up notes, formulations for dose and schedule to support with Humate-P.   ____________________________ Simonne Come. Inez Pilgrim, MD rgg:drc D: 06/19/2011 16:48:09 ET T: 06/20/2011 07:46:03 ET JOB#: [id number]  cc: Simonne Come. Inez Pilgrim, MD, <Dictator> Dallas Schimke MD ELECTRONICALLY SIGNED 07/10/2011 12:05

## 2014-05-18 NOTE — Consult Note (Signed)
Brief Consult Note: Diagnosis: Severe symptomatic IDA secondary to blood loss. Heme positive stool.  History of VWD.  Near syncopal episode.   Consult note dictated.   Discussed with Attending MD.   Comments: Patient's presentation discussed with Dr. Verdie Shire.  Hemotological consultation requested in reference to her history of VWD.  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 Concern exisit with her increase risk of bleeding.  Requesting evaluation and their assistance in being able to proceed with procedure/s. Will continue to monitor laboratory studies as well as hemodynamic status..  Electronic Signatures: Payton Emerald (NP)  (Signed [phone] 18:27)  Authored: Brief Consult Note   Last Updated: 24-May-13 18:27 by Payton Emerald (NP)

## 2014-05-18 NOTE — Consult Note (Signed)
PATIENT NAME:  [name], [name] MR#:  [id number] DATE OF BIRTH:  1955-12-26  DATE OF CONSULTATION:  06/17/2011  REFERRING PHYSICIAN:  Dr. Holley Raring  CONSULTING PHYSICIAN:  Verdie Shire, MD/Reema Chick Ruthe Mannan, NP   PRIMARY CARE PHYSICIAN: [place] Pink, MD   REASON FOR CONSULTATION: Anemia, heme-positive stool, and H. pylori.   HISTORY OF PRESENT ILLNESS: [name] is a 59 year old Caucasian female who has a significant past medical history of hypertension, hyperlipidemia, diabetes, VWD as well as heart murmur. She has had two syncopal episodes due to anemia plus being on blood pressure medications in the past. Upon presenting to the Emergency Room, she was found to have a hemoglobin of 6.7. She does state that her primary doctor has tried to call her today as she had had lab work done with him yesterday suspecting that it was probably due to the severe anemia which was found during her ER visit. The patient states for the past couple of weeks she has been feeling poor, really since the end of February there's been a gradual decline worsening over the past several weeks. She states it was possibly related to her diabetes associated with eating. She increased her water, cutting out starches and snacks. Blood sugar still remained greater than 200. Three weeks ago she traveled out [place] and over that course of time she noticed how pale she had become. She checked her blood pressure which was 100/50 which normally ranges 130's over 70's. Heart rate was 88, which is elevated for her. She has had no energy, has been very puffy appearance today. She was walking up a hill with her husband who was going to get lab work done, near syncopal episode, states she just became worse and worse feeling. Husband advised presenting to the Emergency Room for evaluation. No associated nausea. No vomiting. No abdominal pain. No reflux. No fevers. Bowels move on average every day. Denies any sticky consistency or melena. Over the past  several weeks has been experiencing dizziness, numbness to her fingertips as well as feet and lips. A couple of days in a row she states that she has had pelvic pain to the left lower quadrant where her left ovary has been removed due to ovarian cyst, describes it as a phantom pain.   History of lower GI bleeds in the past in relation to her known history of VWD. Denies ever having an upper endoscopy. The patient is unable to recall when her last colonoscopy was. In reviewing our office records, colonoscopy was performed 12/01/2008 by Dr. Loistine Simas. A multilobulated polyp was found in the rectum. Polyp was 35 mm in size. Biopsies were taken with cold forceps for histology. Histology report revealed tubular villous adenoma.   HOME MEDICATIONS:  1. Metformin 1000 mg 1 tablet twice daily.  2. Lisinopril 5 mg once a day which she held on her own. 3. Levothyroxine 137 mcg 1 tablet once a day. 4. Glipizide 5 mg 1 tablet twice daily.   5. Simvastatin 40 mg 1 tablet daily.   ALLERGIES: None.   PAST MEDICAL HISTORY:  1. Von Willebrand's disease, enlargement of liver demonstrated on CT scan. 2. Diabetes mellitus, non-insulin-dependent. 3. Hypothyroidism. 4. Kidney stone March of 2010.  5. Hyperlipidemia.  6. Heart murmur.  PAST SURGICAL HISTORY:  1. 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  2. Colon polyp removal 2010. 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   FAMILY HISTORY: Father myocardial infarction first occurrence in his 83's, deceased  in his 28's, hypertension, stroke, alcohol abuse. Mother cervical cancer, von Willebrand's, glaucoma. Grandparents with stroke, diabetes. Breast cancer in the family and one aunt with colon as well as liver cancer.   SOCIAL HISTORY: No tobacco. No alcohol use.   REVIEW OF SYSTEMS: CONSTITUTIONAL: Denies any fevers or chills. Significant though for extreme weakness, fatigue. HEENT: No blurred vision, double vision. No  postnasal drainage, epistaxis. CARDIOVASCULAR: No chest pain, heart palpitations. PULMONARY: No cough. Significant for shortness of breath. GI: See history of present illness. GU: No dysuria or hematuria. ENDOCRINE: Denies polyuria or polydipsia. SKIN: Significant for paleness. No jaundice. No cyanosis. HEME/LYMPH: Significant for easy bleeding in correlation with her known history of von Willebrand's disease. NEUROLOGICAL: Near syncopal but no syncopal episode. No seizures.     PHYSICAL EXAMINATION:   VITAL SIGNS: Temperature 98.4 with a pulse of 71, respirations 20, blood pressure 100/56, pulse oximetry 99%.   GENERAL: Well developed, overweight 59 year old Caucasian female, no acute distress noted though extremely pale. Resting comfortably on bed. Visualized the patient is able to ambulate to the bathroom with assistance.   HEENT: Normocephalic, atraumatic. Conjunctivae pale. Sclerae anicteric.   NECK: Supple. Trachea midline. No lymphadenopathy or thyromegaly.   PULMONARY: Symmetric rise and fall of chest. Clear to auscultation throughout. No adventitious sounds.   CARDIOVASCULAR: Regular rate and rhythm, S1, S2. No murmurs. No gallops.   ABDOMEN: Soft, nondistended. Bowel sounds in four quadrants. No bruits. No masses. Evidence of hepatosplenomegaly.   RECTAL: Deferred.   MUSCULOSKELETAL: Movement of all four extremities. No contractures. No clubbing.   EXTREMITIES: No edema.   SKIN: Very pale, warm. Cool to touch. No rashes. No lesions.   NEUROLOGICAL: Cranial nerves II through XII intact.   PSYCH: Alert and oriented x4. Memory grossly intact.   LABORATORY, DIAGNOSTIC, AND RADIOLOGICAL DATA: Chemistry panel on admission glucose elevated at 210, iron 48, TIBC elevated at 463, otherwise within normal limits. Ferritin 4. Hepatic panel albumin 3.2. Cardiac enzymes CK total 27 with a CK-MB of less than 0.5, troponin less than 0.02. TSH 2.3. CBC WBC count elevated at 13.4 with an RBC  of 2.94, hemoglobin 6.7 with hematocrit of 22.7, MCV 77, MCH 22.2, MCHC 29.4, RDW 17.5. Antibody screen is negative. ABO group plus Rh type is A+. Two units type and crossmatched. PT is 13.3 with an INR of 1.0 and a PTT of 32.0.   CT of head without contrast was ordered for the indication of dizziness. No acute intracranial bleeding noted.   Chest, PA and lateral for weakness no evidence of pneumonia or pulmonary alveolar edema. There is minimal prominence of the perihilar lung markings. Question of whether the patient has a smoking history which is not true.   IMPRESSION: Severe iron deficiency anemia secondary to chronic blood loss. Physical examination by ER physician revealed stool to be grossly guaiac-positive. In review of the record, known history of VWD. She has a history of lower GI bleeding in the past. Symptomatic with near syncopal episode today.   PLAN: The patient's presentation was discussed with Dr. Verdie Shire. Recommendation is to proceed forward with a hematological evaluation at this time given her history of VWD. Wish to consider proceeding forward with endoscopic evaluation first with an EGD. Concern exists though that if biopsies are warranted the patient is at high risk for bleeding. While awaiting hematological consultation, do recommend for the patient to be on PPI therapy twice a day. Antiemetic medication as well as continued monitoring of hemoglobin and  hematocrit and in agreement to transfuse as necessary.   These services provided by Payton Emerald, NP under collaborative agreement with Verdie Shire, MD.  ____________________________ Payton Emerald, NP dsh:drc D: 06/17/2011 18:20:18 ET T: 06/18/2011 12:39:27 ET JOB#: [id number]  cc: Payton Emerald, NP, <Dictator> Payton Emerald MD ELECTRONICALLY SIGNED 06/22/2011 15:29

## 2014-05-18 NOTE — Consult Note (Signed)
Brief Consult Note: Diagnosis: Von willebrands disease  Subacute GI bleed.   Patient was seen by consultant.   Consult note dictated.   Recommend to proceed with surgery or procedure.   Recommend further assessment or treatment.   Discussed with Attending MD.   Comments: See dictated note  Hx WWD. PRIOR BLEEDING EPISODIC LIFETIME. UNKNOWN IF TYPE 2A OR 2B. HAS HAD RESPONSE DOCUMENTED TO DDAVP IN THE PAST BUT HX OF HEADACHE AND ELEVATED BP REACTION AFTER DOSING.  CURRENTLY NO EPISODE OF ACTIVE OR ACUTE BLEED. NO DARK STOOLS, NO BM, IS S/P TRANSFUSION 2 U PRBC, HGB UP FROM 6.7 TO 7.5. EXAM BENIGN.   PLAN IF EVIDENCE OF CONTINUED BLEEDING, START HEMATE P , INITIALLY Q12H, DOSAGES APPROPRIATE FOR MIKD HEMORRHAGE, OTHERWISE GIVE ONLY A PROPHYLACTIC DOSE BEFOE EGD. THEN EVALUATE FOR IV OR PO IRON.  Electronic Signatures: Dallas Schimke (MD)  (Signed [phone] 12:53)  Authored: Brief Consult Note   Last Updated: 25-May-13 12:53 by Dallas Schimke (MD)

## 2014-09-25 ENCOUNTER — Other Ambulatory Visit: Payer: Self-pay | Admitting: Family Medicine

## 2014-09-25 DIAGNOSIS — Z1231 Encounter for screening mammogram for malignant neoplasm of breast: Secondary | ICD-10-CM

## 2014-10-01 ENCOUNTER — Ambulatory Visit
Admission: RE | Admit: 2014-10-01 | Discharge: 2014-10-01 | Disposition: A | Discharge status: Home / Self Care | Payer: 59 | Source: Ambulatory Visit | Attending: Family Medicine | Admitting: Family Medicine

## 2014-10-01 DIAGNOSIS — Z1231 Encounter for screening mammogram for malignant neoplasm of breast: Secondary | ICD-10-CM | POA: Insufficient documentation

## 2014-10-01 HISTORY — DX: Malignant (primary) neoplasm, unspecified: C80.1

## 2015-11-02 ENCOUNTER — Other Ambulatory Visit: Payer: Self-pay | Admitting: Family Medicine

## 2015-11-02 DIAGNOSIS — Z1231 Encounter for screening mammogram for malignant neoplasm of breast: Secondary | ICD-10-CM

## 2015-12-02 ENCOUNTER — Other Ambulatory Visit: Payer: 59

## 2015-12-08 ENCOUNTER — Ambulatory Visit
Admission: RE | Admit: 2015-12-08 | Discharge: 2015-12-08 | Disposition: A | Discharge status: Home / Self Care | Payer: 59 | Source: Ambulatory Visit | Attending: Family Medicine | Admitting: Family Medicine

## 2015-12-08 DIAGNOSIS — Z1231 Encounter for screening mammogram for malignant neoplasm of breast: Secondary | ICD-10-CM

## 2015-12-10 ENCOUNTER — Other Ambulatory Visit: Payer: Self-pay | Admitting: Family Medicine

## 2015-12-10 DIAGNOSIS — R921 Mammographic calcification found on diagnostic imaging of breast: Secondary | ICD-10-CM

## 2015-12-11 ENCOUNTER — Ambulatory Visit: Admit: 2015-12-11 | Payer: 59 | Admitting: Obstetrics and Gynecology

## 2015-12-11 SURGERY — DILATATION & CURETTAGE/HYSTEROSCOPY WITH MYOSURE
Anesthesia: Choice

## 2015-12-24 ENCOUNTER — Ambulatory Visit
Admission: RE | Admit: 2015-12-24 | Discharge: 2015-12-24 | Disposition: A | Discharge status: Home / Self Care | Payer: 59 | Source: Ambulatory Visit | Attending: Family Medicine | Admitting: Family Medicine

## 2015-12-24 ENCOUNTER — Other Ambulatory Visit: Payer: Self-pay | Admitting: Family Medicine

## 2015-12-24 DIAGNOSIS — R921 Mammographic calcification found on diagnostic imaging of breast: Secondary | ICD-10-CM

## 2015-12-29 ENCOUNTER — Other Ambulatory Visit: Payer: Self-pay | Admitting: Family Medicine

## 2015-12-29 DIAGNOSIS — R928 Other abnormal and inconclusive findings on diagnostic imaging of breast: Secondary | ICD-10-CM

## 2015-12-29 DIAGNOSIS — R921 Mammographic calcification found on diagnostic imaging of breast: Secondary | ICD-10-CM

## 2016-01-12 ENCOUNTER — Ambulatory Visit
Admission: RE | Admit: 2016-01-12 | Discharge: 2016-01-12 | Disposition: A | Discharge status: Home / Self Care | Payer: 59 | Source: Ambulatory Visit | Attending: Family Medicine | Admitting: Family Medicine

## 2016-01-12 DIAGNOSIS — R921 Mammographic calcification found on diagnostic imaging of breast: Secondary | ICD-10-CM

## 2016-01-12 DIAGNOSIS — R928 Other abnormal and inconclusive findings on diagnostic imaging of breast: Secondary | ICD-10-CM

## 2016-01-12 HISTORY — PX: BREAST BIOPSY: SHX20

## 2016-01-14 ENCOUNTER — Encounter: Payer: Self-pay | Admitting: *Deleted

## 2016-01-14 LAB — SURGICAL PATHOLOGY

## 2016-01-14 NOTE — Progress Notes (Signed)
  Oncology Nurse Navigator Documentation  Navigator Location: [facility] (01/14/16 1600)   )Navigator Encounter Type: Introductory phone call (01/14/16 1600)   Abnormal Finding Date: 01/01/16 (01/14/16 1600) Confirmed Diagnosis Date: 01/14/16 (01/14/16 1600)    Called patient today to establish navigation services.  Patient newly diagnosed with DCIS.  She is scheduled to see Dr. Rochel Brome for surgical consultation on 01/21/16 @ 14.  She is to call if she has any questions or needs.  Will take educational literature to his office for her to pick up.  She is to call with any questions or needs.   Barriers/Navigation Needs: Coordination of Care (01/14/16 1600)   Interventions: Coordination of Care (01/14/16 1600)   Coordination of Care: Appts (01/14/16 1600)                  Time Spent with Patient: 45 (01/14/16 1600)

## 2016-01-26 ENCOUNTER — Telehealth: Payer: Self-pay | Admitting: *Deleted

## 2016-01-26 NOTE — Telephone Encounter (Addendum)
  Oncology Nurse Navigator Documentation  Navigator Location: [facility] (01/26/16 1000) Referral date to RadOnc/MedOnc: 01/27/16 (01/26/16 1000) )Navigator Encounter Type: Telephone (01/26/16 1000) Telephone: Lahoma Crocker Call (01/26/16 1000)     Dr. Thompson Caul office called Friday and wants patient seen by Medical Oncology here prior to scheduling her surgery.  I have scheduled patient an appointment to see Dr. Grayland Ormond on Wednesday at 8:30.    Received call from [facility] in scheduling that [facility] had called and cancelled the patient's appointment for tomorrow.  Called patient and confirmed with her that we were to cancel her appointment.  She confirms that [facility] said they had been taking care of her Von Willbrands disease and it would be better to have her surgery and care there.  She was very grateful for our service.  She is to call if she has any questions or needs.    Barriers/Navigation Needs: Coordination of Care (01/26/16 1000)   Interventions: Coordination of Care (01/26/16 1000)   Coordination of Care: Appts (01/26/16 1000)                  Time Spent with Patient: 30 (01/26/16 1000)

## 2016-01-27 ENCOUNTER — Inpatient Hospital Stay: Payer: 59 | Admitting: Oncology

## 2017-04-25 ENCOUNTER — Other Ambulatory Visit: Payer: Self-pay | Admitting: Otolaryngology

## 2017-04-25 DIAGNOSIS — R42 Dizziness and giddiness: Secondary | ICD-10-CM

## 2017-05-02 ENCOUNTER — Ambulatory Visit
Admission: RE | Admit: 2017-05-02 | Discharge: 2017-05-02 | Disposition: A | Discharge status: Home / Self Care | Payer: 59 | Source: Ambulatory Visit | Attending: Otolaryngology | Admitting: Otolaryngology

## 2017-05-02 DIAGNOSIS — R42 Dizziness and giddiness: Secondary | ICD-10-CM | POA: Insufficient documentation

## 2017-05-02 DIAGNOSIS — Z888 Allergy status to other drugs, medicaments and biological substances status: Secondary | ICD-10-CM | POA: Insufficient documentation

## 2017-05-02 DIAGNOSIS — Z886 Allergy status to analgesic agent status: Secondary | ICD-10-CM | POA: Insufficient documentation

## 2017-05-02 DIAGNOSIS — M2548 Effusion, other site: Secondary | ICD-10-CM | POA: Diagnosis not present

## 2017-05-02 LAB — POCT I-STAT CREATININE: CREATININE: 1.4 mg/dL — AB (ref 0.44–1.00)

## 2017-05-02 MED ORDER — GADOBENATE DIMEGLUMINE 529 MG/ML IV SOLN
20.0000 mL | Freq: Once | INTRAVENOUS | Status: AC | PRN
  Administered 2017-05-02: 9 mL via INTRAVENOUS

## 2017-10-26 ENCOUNTER — Other Ambulatory Visit: Payer: Self-pay | Admitting: Otolaryngology

## 2017-10-26 DIAGNOSIS — R42 Dizziness and giddiness: Secondary | ICD-10-CM

## 2017-11-06 ENCOUNTER — Ambulatory Visit
Admission: RE | Admit: 2017-11-06 | Discharge: 2017-11-06 | Disposition: A | Discharge status: Home / Self Care | Payer: 59 | Source: Ambulatory Visit | Attending: Otolaryngology | Admitting: Otolaryngology

## 2017-11-06 DIAGNOSIS — R42 Dizziness and giddiness: Secondary | ICD-10-CM

## 2017-11-10 ENCOUNTER — Other Ambulatory Visit: Payer: Self-pay

## 2017-11-10 ENCOUNTER — Emergency Department: Payer: 59

## 2017-11-10 ENCOUNTER — Emergency Department
Admission: EM | Admit: 2017-11-10 | Discharge: 2017-11-10 | Disposition: A | Discharge status: Home / Self Care | Payer: 59 | Attending: Emergency Medicine | Admitting: Emergency Medicine

## 2017-11-10 DIAGNOSIS — M25562 Pain in left knee: Secondary | ICD-10-CM | POA: Diagnosis not present

## 2017-11-10 DIAGNOSIS — I10 Essential (primary) hypertension: Secondary | ICD-10-CM | POA: Insufficient documentation

## 2017-11-10 DIAGNOSIS — E119 Type 2 diabetes mellitus without complications: Secondary | ICD-10-CM | POA: Diagnosis not present

## 2017-11-10 DIAGNOSIS — Z853 Personal history of malignant neoplasm of breast: Secondary | ICD-10-CM | POA: Diagnosis not present

## 2017-11-10 DIAGNOSIS — Z85828 Personal history of other malignant neoplasm of skin: Secondary | ICD-10-CM | POA: Insufficient documentation

## 2017-11-10 HISTORY — DX: Malignant neoplasm of unspecified site of unspecified female breast: C50.919

## 2017-11-10 HISTORY — DX: Disorder of kidney and ureter, unspecified: N28.9

## 2017-11-10 HISTORY — DX: Unspecified glaucoma: H40.9

## 2017-11-10 HISTORY — DX: Type 2 diabetes mellitus without complications: E11.9

## 2017-11-10 HISTORY — DX: Essential (primary) hypertension: I10

## 2017-11-10 HISTORY — DX: Von Willebrand's disease: D68.0

## 2017-11-10 HISTORY — DX: Von Willebrand disease, type 2a: D68.020

## 2017-11-10 MED ORDER — PREDNISONE 50 MG PO TABS: ORAL_TABLET | ORAL | 0 refills | Status: DC

## 2017-11-10 NOTE — ED Triage Notes (Signed)
Pt states she was bent down yesterday doing something and when she went to stand she had severe pain in the left knee and was not able to stand back up without assistance . Pt has a knee brace in place on arrival.

## 2017-11-10 NOTE — ED Provider Notes (Signed)
[facility] Emergency Department Provider Note  ____________________________________________  Time seen: Approximately 3:29 PM  I have reviewed the triage vital signs and the nursing notes.   HISTORY  Chief Complaint Knee Pain (left)    HPI [name] is a 62 y.o. female presents to the emergency department with acute left knee pain and a feeling of instability.  Patient reports that she was in a bent knee position when she went to go stand up and felt her knee "give out".  Patient denies falls or traumas.  Patient reports that she has a history of obesity and has recently lost a lot of weight.  She denies numbness or tingling in the lower extremities.  She has been previously diagnosed with severe osteoarthritis of the right knee.  Patient cannot take anti-inflammatories due to history of von Willebrand.   Past Medical History:  Diagnosis Date  . Breast cancer ([place])   . Cancer ([place])    skin ca  . Diabetes mellitus without complication ([place])   . Glaucoma   . Hypertension   . Renal disorder   . Von Willebrand disease, type IIa ([place])     There are no active problems to display for this patient.   Past Surgical History:  Procedure Laterality Date  . BREAST BIOPSY Right 01/12/2016   path pending/stereo affirm  . OOPHORECTOMY Left     Prior to Admission medications   Medication Sig Start Date End Date Taking? Authorizing Provider  predniSONE (DELTASONE) 50 MG tablet Take one 50 mg tablet once daily for the next five days. 11/10/17   Lannie Fields, PA-C    Allergies Patient has no known allergies.  Family History  Problem Relation Age of Onset  . Breast cancer Maternal Aunt 71       2 mat aunts  . Breast cancer Cousin   . Breast cancer Other     Social History Social History   Tobacco Use  . Smoking status: Never Smoker  . Smokeless tobacco: Never Used  Substance Use Topics  . Alcohol use: Not Currently  . Drug use: Not  Currently     Review of Systems  Constitutional: No fever/chills Eyes: No visual changes. No discharge ENT: No upper respiratory complaints. Cardiovascular: no chest pain. Respiratory: no cough. No SOB. Gastrointestinal: No abdominal pain.  No nausea, no vomiting.  No diarrhea.  No constipation. Genitourinary: Negative for dysuria. No hematuria Musculoskeletal: Patient has left knee pain. Skin: Negative for rash, abrasions, lacerations, ecchymosis. Neurological: Negative for headaches, focal weakness or numbness.   ____________________________________________   PHYSICAL EXAM:  VITAL SIGNS: ED Triage Vitals  Enc Vitals Group     BP 11/10/17 1236 107/85     Pulse Rate 11/10/17 1236 68     Resp 11/10/17 1236 17     Temp 11/10/17 1236 98.1 F (36.7 C)     Temp Source 11/10/17 1236 Oral     SpO2 11/10/17 1236 98 %     Weight 11/10/17 1237 183 lb (83 kg)     Height 11/10/17 1237 5\' 2"  (1.575 m)     Head Circumference --      Peak Flow --      Pain Score 11/10/17 1236 10     Pain Loc --      Pain Edu? --      Excl. in [place]? --      Constitutional: Alert and oriented. Well appearing and in no acute distress. Eyes: Conjunctivae are  normal. PERRL. EOMI. Head: Atraumatic. Cardiovascular: Normal rate, regular rhythm. Normal S1 and S2.  Good peripheral circulation. Respiratory: Normal respiratory effort without tachypnea or retractions. Lungs CTAB. Good air entry to the bases with no decreased or absent breath sounds. Gastrointestinal: Bowel sounds 4 quadrants. Soft and nontender to palpation. No guarding or rigidity. No palpable masses. No distention. No CVA tenderness. Musculoskeletal: To inspection, no loss of peripatellar dimpling, left.  Patient is able to demonstrate full range of motion of the left knee.  Patient has pain with palpation over the medial joint line.  Left knee: Negative anterior and posterior drawer test.  Negative ballottement.  Positive McMurray's.  Palpable  dorsalis pedis pulse, left.  Is Neurologic:  Normal speech and language. No gross focal neurologic deficits are appreciated.  Skin:  Skin is warm, dry and intact. No rash noted. Psychiatric: Mood and affect are normal. Speech and behavior are normal. Patient exhibits appropriate insight and judgement.   ____________________________________________   LABS (all labs ordered are listed, but only abnormal results are displayed)  Labs Reviewed - No data to display ____________________________________________  EKG   ____________________________________________  RADIOLOGY I personally viewed and evaluated these images as part of my medical decision making, as well as reviewing the written report by the radiologist  Dg Knee Complete 4 Views Left  Result Date: 11/10/2017 CLINICAL DATA:  Left knee pain and fall. Initial encounter. EXAM: LEFT KNEE - COMPLETE 4+ VIEW COMPARISON:  None. FINDINGS: Tricompartmental degenerative spurring with mild medial compartment narrowing and lateral translation of the tibia. Probable patellofemoral compartment narrowing but no confirmatory sunrise view. No superimposed acute fracture. No joint effusion. Nonspecific anterior soft tissue swelling. IMPRESSION: 1. Negative for fracture or joint effusion. 2. Tricompartmental osteoarthritis. Electronically Signed   By: Monte Fantasia M.D.   On: 11/10/2017 13:26    ____________________________________________    PROCEDURES  Procedure(s) performed:    Procedures    Medications - No data to display   ____________________________________________   INITIAL IMPRESSION / ASSESSMENT AND PLAN / ED COURSE  Pertinent labs & imaging results that were available during my care of the patient were reviewed by me and considered in my medical decision making (see chart for details).  Review of the [place] CSRS was performed in accordance of the [place] prior to dispensing any controlled drugs.      Assessment and  plan Osteoarthritis Patient presents to the emergency department with acute left knee pain.  X-ray findings are consistent with advanced degenerative changes of the left knee.  Physical exam findings are also consistent with osteoarthritis.  Patient was placed on a short course of prednisone given inability to use anti-inflammatories.  A referral was given orthopedics.  All patient questions were answered.    ____________________________________________  FINAL CLINICAL IMPRESSION(S) / ED DIAGNOSES  Final diagnoses:  Acute pain of left knee      NEW MEDICATIONS STARTED DURING THIS VISIT:  ED Discharge Orders         Ordered    predniSONE (DELTASONE) 50 MG tablet     11/10/17 1605              This chart was dictated using voice recognition software/Dragon. Despite best efforts to proofread, errors can occur which can change the meaning. Any change was purely unintentional.    Lannie Fields, PA-C 11/10/17 1826    Harvest Dark, MD 11/10/17 [phone]

## 2018-05-30 IMAGING — MG MM DIGITAL DIAGNOSTIC UNILAT*R* W/ TOMO W/ CAD
9 series · 9 of 17 positions shown · non-contrast
Comparison: Previous exam(s).

CLINICAL DATA: Right breast upper outer quadrant calcifications
seen on most recent screening mammography.

EXAM:
DIGITAL DIAGNOSTIC RIGHT MAMMOGRAM WITH CAD

[R ML (1 of 3)]
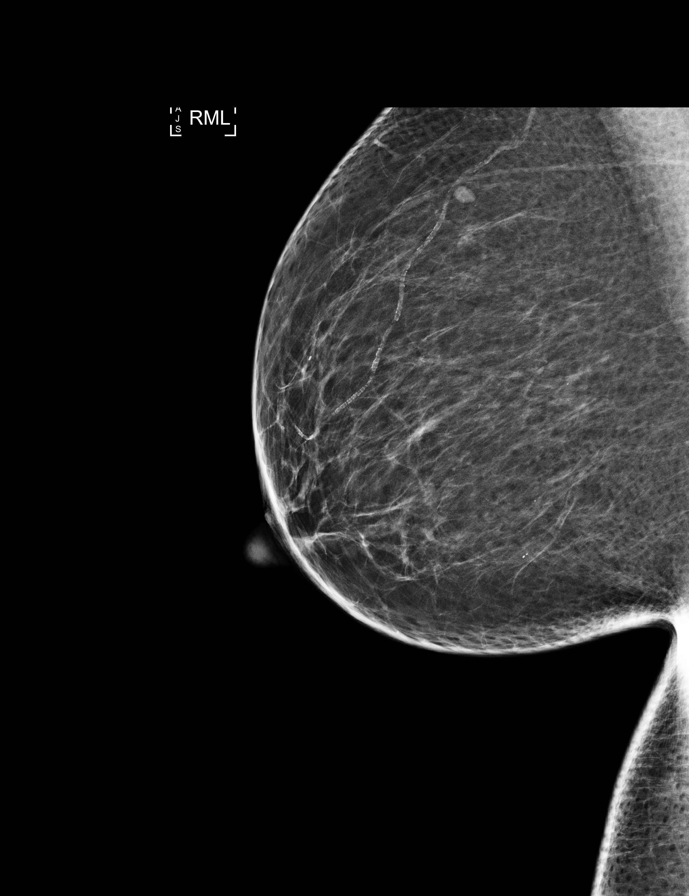

[R CC (1 of 2)]
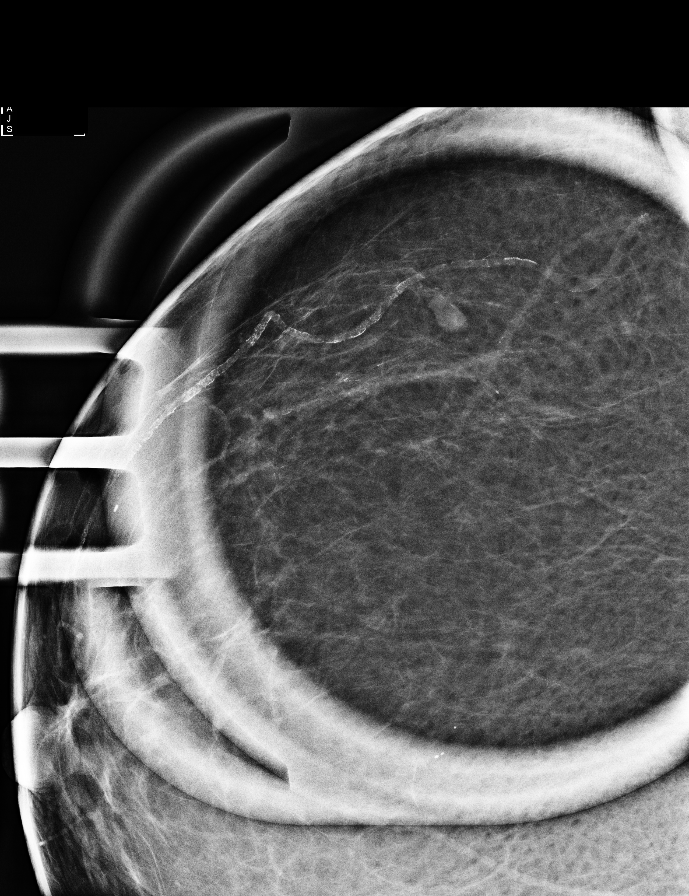

[R ML (2 of 3)]
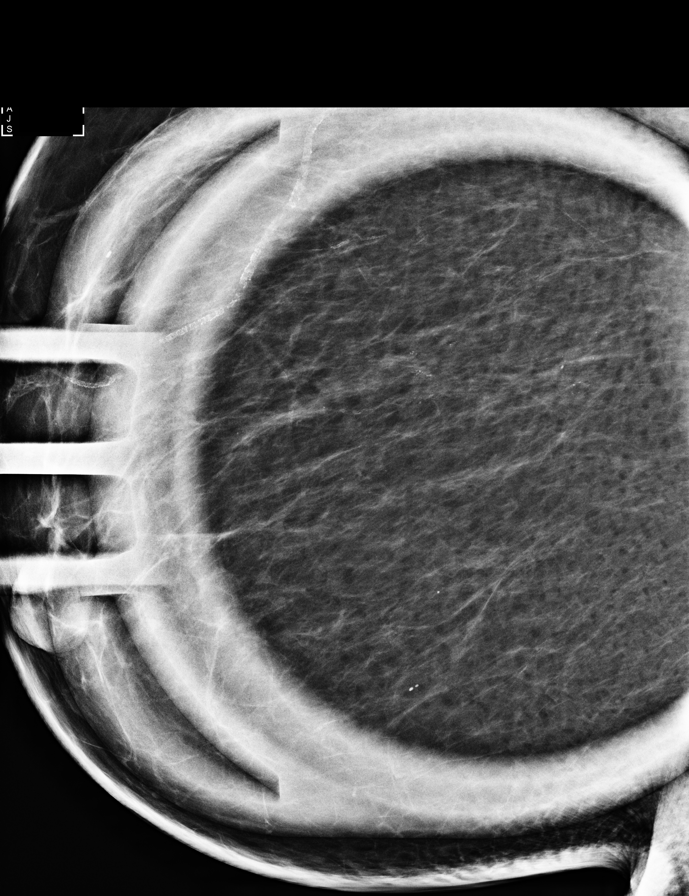

[R ML synth-2D]
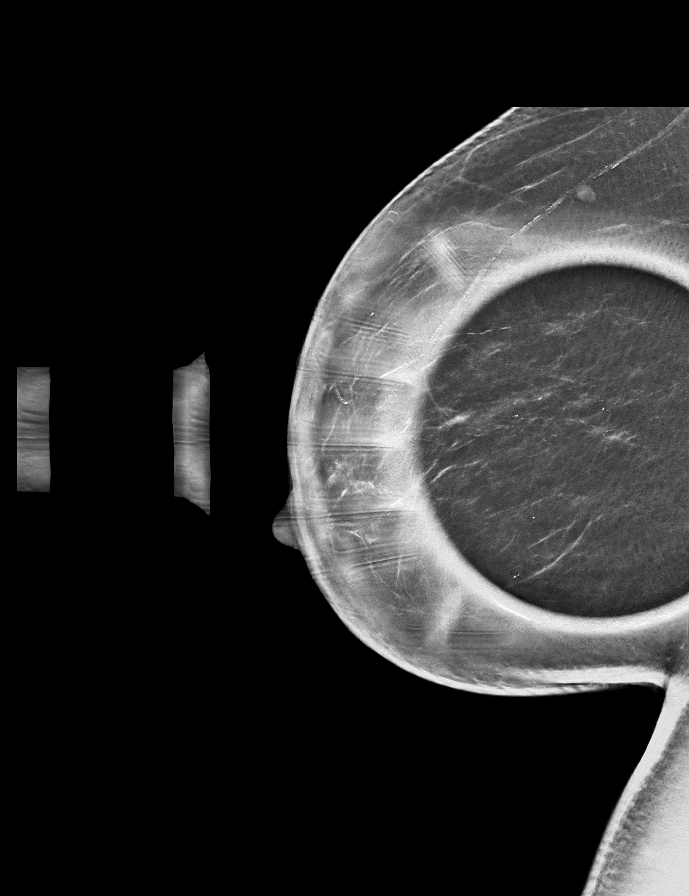

[R ML (3 of 3)]
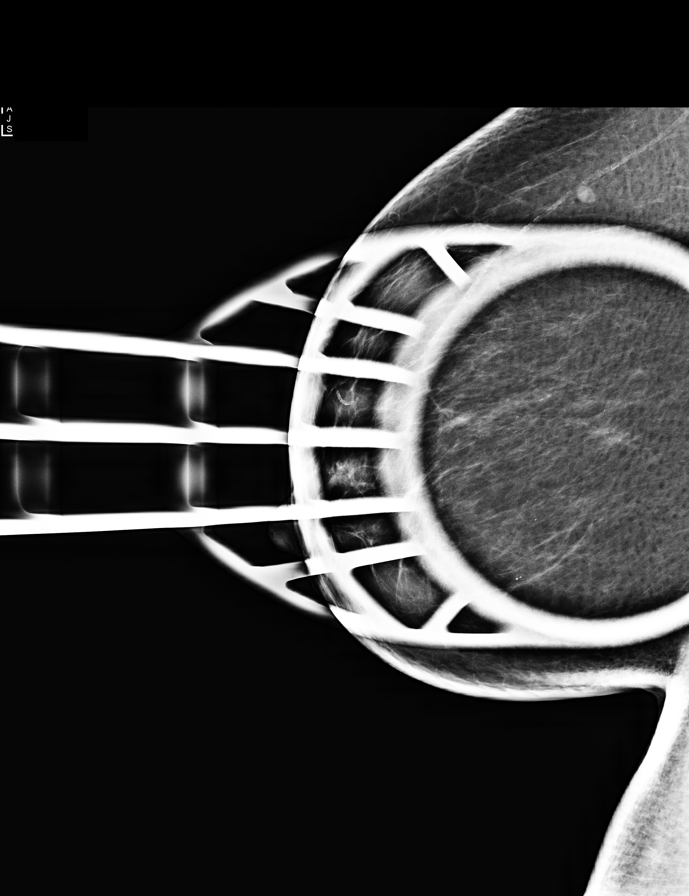

[R CC (2 of 2)]
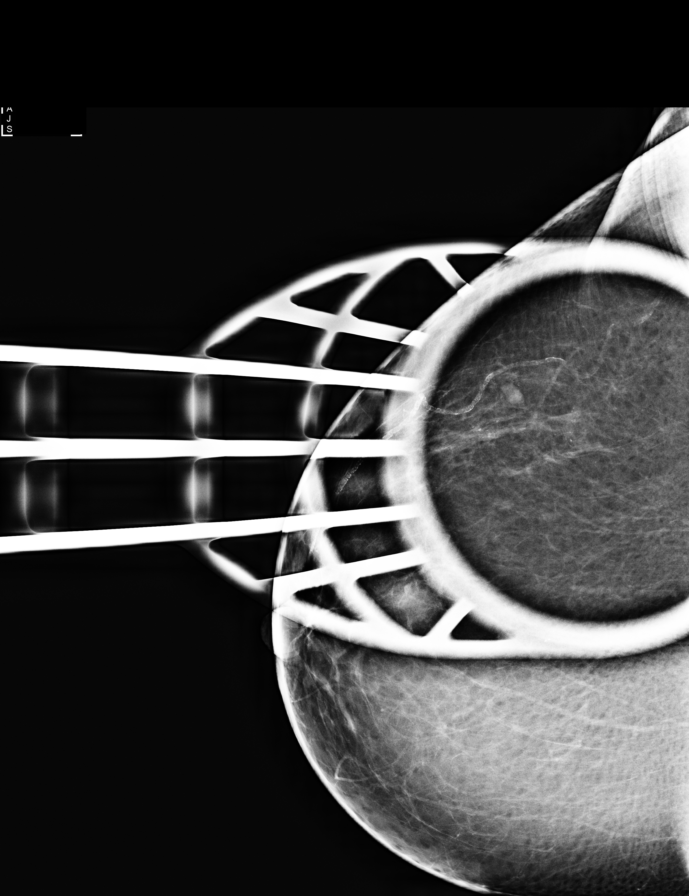

[R CC synth-2D]
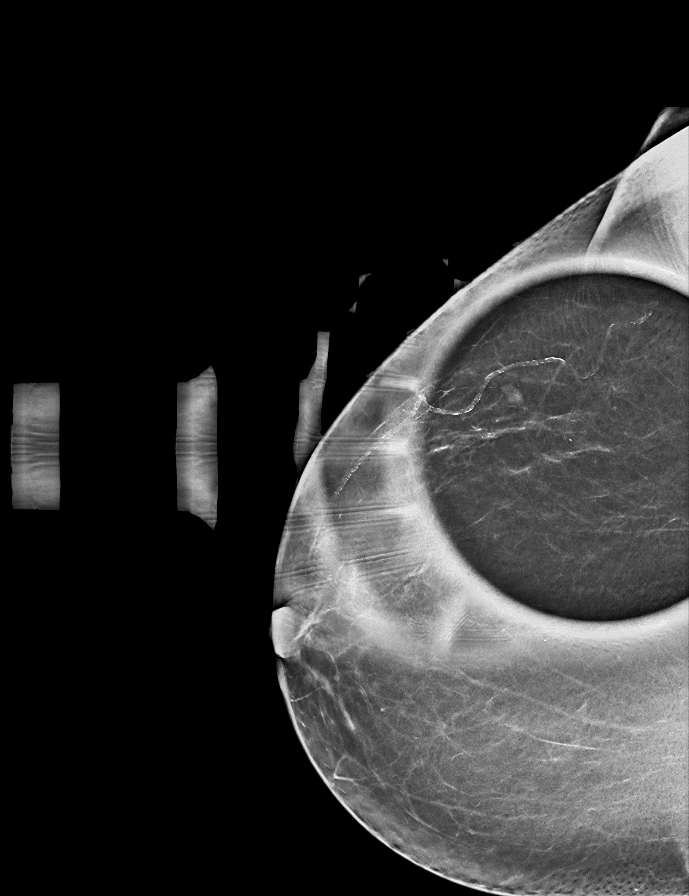

[R ML tomo · tomo slice 31/60.0]
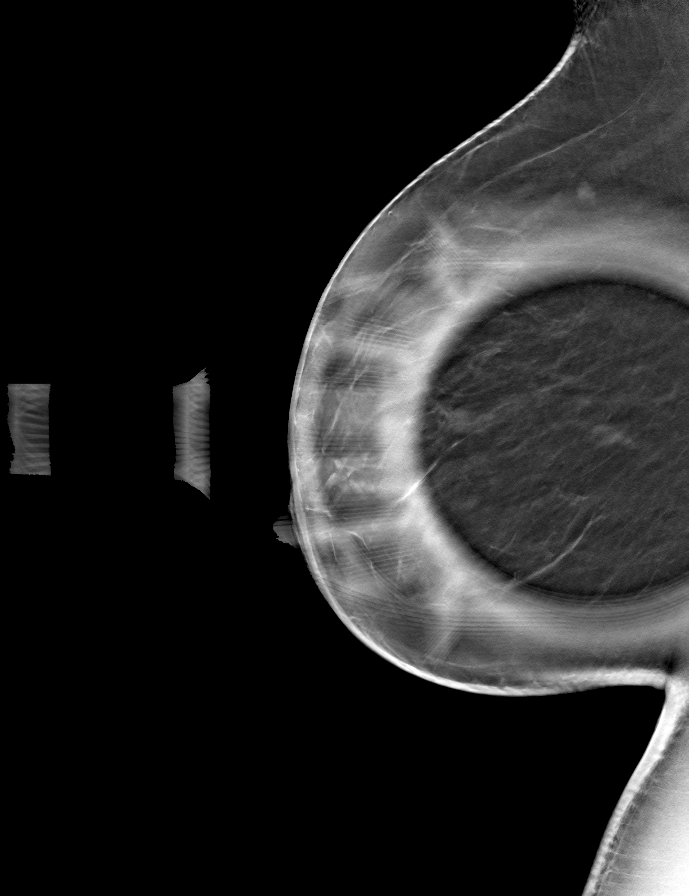

[R CC tomo · tomo slice 27/53.0]
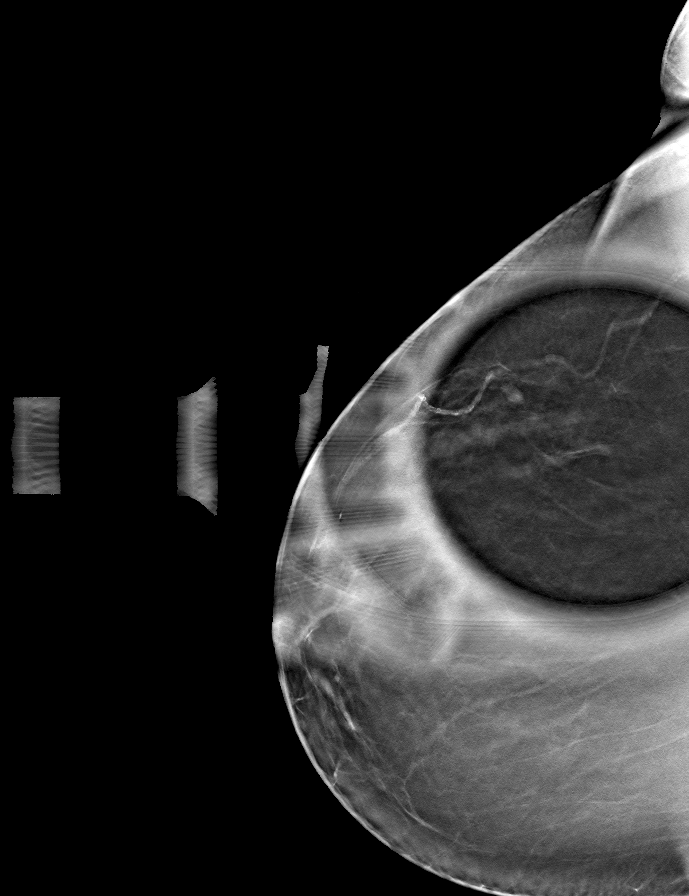

[9 of 17 positions shown; findings below may reference images not displayed]

ACR Breast Density Category b: There are scattered areas of
fibroglandular density.
FINDINGS: There is a loosely grouped segmental suspicious and morphology
calcifications in the right breast upper outer quadrant, posterior
depth measuring 4.9 x 4.2 x 1.6 cm. Please note that vascular
calcifications are seen intermixed with the probably ductal
calcifications. No associated suspicious masses seen.

Mammographic images were processed with CAD.
IMPRESSION: Suspicious right breast upper outer quadrant group of calcifications
for which stereotactic core needle biopsy is recommended.

RECOMMENDATION:
Stereotactic core needle biopsy of the right breast.

I have discussed the findings and recommendations with the patient.
Results were also provided in writing at the conclusion of the
visit. If applicable, a reminder letter will be sent to the patient
regarding the next appointment.

BI-RADS CATEGORY  4: Suspicious.

## 2019-04-13 ENCOUNTER — Ambulatory Visit: Payer: 59 | Attending: Internal Medicine

## 2019-04-13 DIAGNOSIS — Z23 Encounter for immunization: Secondary | ICD-10-CM

## 2019-04-13 NOTE — Progress Notes (Signed)
   Covid-19 Vaccination Clinic  Name:  ETNA WIDNER    MRN: [id number] DOB: 05-24-1955  04/13/2019  Ms. Beaubrun was observed post Covid-19 immunization for 15 minutes without incident. She was provided with Vaccine Information Sheet and instruction to access the V-Safe system.   Ms. Sagel was instructed to call 911 with any severe reactions post vaccine: Marland Kitchen Difficulty breathing  . Swelling of face and throat  . A fast heartbeat  . A bad rash all over body  . Dizziness and weakness   Immunizations Administered    Name Date Dose VIS Date Route   Pfizer COVID-19 Vaccine 04/13/2019  8:40 AM 0.3 mL 01/04/2019 Intramuscular   Manufacturer: [employer]   Lot: [id number]   [place]: [id number]

## 2019-04-20 ENCOUNTER — Ambulatory Visit: Payer: 59

## 2019-05-07 ENCOUNTER — Ambulatory Visit: Payer: 59 | Attending: Internal Medicine

## 2019-05-07 DIAGNOSIS — Z23 Encounter for immunization: Secondary | ICD-10-CM

## 2019-05-07 NOTE — Progress Notes (Signed)
   Covid-19 Vaccination Clinic  Name:  [name]    MRN: [id number] DOB: [birth year]  05/07/2019  [name] was observed post Covid-19 immunization for 30 minutes based on pre-vaccination screening without incident. She was provided with Vaccine Information Sheet and instruction to access the V-Safe system.   [name] was instructed to call 911 with any severe reactions post vaccine: Marland Kitchen Difficulty breathing  . Swelling of face and throat  . A fast heartbeat  . A bad rash all over body  . Dizziness and weakness   Immunizations Administered    Name Date Dose VIS Date Route   Pfizer COVID-19 Vaccine 05/07/2019 12:22 PM 0.3 mL 01/04/2019 Intramuscular   Manufacturer: [place]   Lot: [id number]   [place]: [id number]

## 2020-03-30 ENCOUNTER — Other Ambulatory Visit: Payer: Self-pay | Admitting: Family Medicine

## 2020-03-30 DIAGNOSIS — R600 Localized edema: Secondary | ICD-10-CM

## 2020-04-08 ENCOUNTER — Ambulatory Visit
Admission: RE | Admit: 2020-04-08 | Discharge: 2020-04-08 | Disposition: A | Discharge status: Home / Self Care | Payer: 59 | Source: Ambulatory Visit | Attending: Family Medicine | Admitting: Family Medicine

## 2020-04-08 ENCOUNTER — Other Ambulatory Visit: Payer: Self-pay

## 2020-04-08 DIAGNOSIS — R600 Localized edema: Secondary | ICD-10-CM | POA: Diagnosis present

## 2020-06-18 ENCOUNTER — Other Ambulatory Visit: Payer: Self-pay

## 2020-06-18 ENCOUNTER — Encounter (INDEPENDENT_AMBULATORY_CARE_PROVIDER_SITE_OTHER): Payer: Self-pay | Admitting: Vascular Surgery

## 2020-06-18 ENCOUNTER — Ambulatory Visit (INDEPENDENT_AMBULATORY_CARE_PROVIDER_SITE_OTHER): Payer: 59 | Admitting: Vascular Surgery

## 2020-06-18 DIAGNOSIS — I890 Lymphedema, not elsewhere classified: Secondary | ICD-10-CM | POA: Diagnosis not present

## 2020-06-18 DIAGNOSIS — E782 Mixed hyperlipidemia: Secondary | ICD-10-CM

## 2020-06-18 DIAGNOSIS — M79669 Pain in unspecified lower leg: Secondary | ICD-10-CM

## 2020-06-18 DIAGNOSIS — M7989 Other specified soft tissue disorders: Secondary | ICD-10-CM

## 2020-06-18 DIAGNOSIS — E1169 Type 2 diabetes mellitus with other specified complication: Secondary | ICD-10-CM

## 2020-06-18 DIAGNOSIS — I10 Essential (primary) hypertension: Secondary | ICD-10-CM | POA: Diagnosis not present

## 2020-06-22 ENCOUNTER — Encounter (INDEPENDENT_AMBULATORY_CARE_PROVIDER_SITE_OTHER): Payer: Self-pay | Admitting: Vascular Surgery

## 2020-06-22 DIAGNOSIS — E785 Hyperlipidemia, unspecified: Secondary | ICD-10-CM | POA: Insufficient documentation

## 2020-06-22 DIAGNOSIS — M79669 Pain in unspecified lower leg: Secondary | ICD-10-CM | POA: Insufficient documentation

## 2020-06-22 DIAGNOSIS — I10 Essential (primary) hypertension: Secondary | ICD-10-CM | POA: Insufficient documentation

## 2020-06-22 DIAGNOSIS — M7989 Other specified soft tissue disorders: Secondary | ICD-10-CM | POA: Insufficient documentation

## 2020-06-22 DIAGNOSIS — E119 Type 2 diabetes mellitus without complications: Secondary | ICD-10-CM | POA: Insufficient documentation

## 2020-06-22 DIAGNOSIS — I890 Lymphedema, not elsewhere classified: Secondary | ICD-10-CM | POA: Insufficient documentation

## 2020-06-22 NOTE — Progress Notes (Signed)
MRN : [id number]  [name] is a 65 y.o. (1956/01/09) female who presents with chief complaint of  Chief Complaint  Patient presents with  . New Patient (Initial Visit)    New patient white right leg edema   .  History of Present Illness:   Patient is seen for evaluation of leg pain and leg swelling. The patient first noticed the swelling remotely. The swelling is associated with pain and discoloration. The pain and swelling worsens with prolonged dependency and improves with elevation. The pain is unrelated to activity.  The patient notes that in the morning the legs are significantly improved but they steadily worsened throughout the course of the day. The patient also notes a steady worsening of the discoloration in the ankle and shin area.   The patient denies claudication symptoms.  The patient denies symptoms consistent with rest pain.  The patient denies and extensive history of DJD and LS spine disease.  The patient has no had any past angiography, interventions or vascular surgery.  Elevation makes the leg symptoms better, dependency makes them much worse. There is no history of ulcerations. The patient denies any recent changes in medications.  The patient has been wearing graduated compression for years but states that her edema is worsening in spite of compression.  The patient denies a history of DVT or PE. There is no prior history of phlebitis. There is no history of primary lymphedema.  No history of malignancies. No history of trauma or groin or pelvic surgery. There is no history of radiation treatment to the groin or pelvis  The patient denies amaurosis fugax or recent TIA symptoms. There are no recent neurological changes noted. The patient denies recent episodes of angina or shortness of breath.  Duplex ultrasound of the venous system at an outside institution was negative for reflux, no deep venous obstruction  Current Meds  Medication Sig  .  Acetaminophen 650 MG/20.3ML SUSP Take 1 tablet by mouth daily as needed.  . ACETAMINOPHEN 8 HOUR PO Take 1 tablet by mouth daily.  Marland Kitchen antihemophilic factor-vwf (HUMATE-P) [phone] units SOLR injection Infuse Humate P 4130 RCOunits (+/-10%) IVP 3x weekly on M-W-F and Q12-24 hours prn bleeding as directed  . bimatoprost (LUMIGAN) 0.01 % SOLN Apply to eye.  . brimonidine (ALPHAGAN) 0.2 % ophthalmic solution Apply to eye.  . dorzolamide-timolol (COSOPT) 22.3-6.8 MG/ML ophthalmic solution Apply to eye.  . furosemide (LASIX) 20 MG tablet Take 20 mg by mouth daily.  Marland Kitchen glimepiride (AMARYL) 4 MG tablet Take by mouth.  Marland Kitchen glucose blood (ACCU-CHEK AVIVA PLUS) test strip Use 2 (two) times daily. Use as instructed.  Marland Kitchen glucose blood (PRECISION QID TEST) test strip Use 2 (two) times daily. Use as instructed.  . Insulin Pen Needle (NOVOTWIST PEN NEEDLE) 32G X 5 MM MISC USE AS DRECTED  . levothyroxine (SYNTHROID) 125 MCG tablet Take by mouth.  . liraglutide (VICTOZA) 18 MG/3ML SOPN DIAL AND INJECT UNDER THE SKIN 1.8 MG DAILY *APPT NEEDED*  . meclizine (ANTIVERT) 25 MG tablet Take 1 tablet by mouth every 6 (six) hours as needed.  . ondansetron (ZOFRAN) 4 MG tablet Take by mouth.  . pioglitazone-metformin (ACTOPLUS MET) 15-500 MG tablet Take by mouth.  . predniSONE (DELTASONE) 50 MG tablet Take one 50 mg tablet once daily for the next five days.  . simvastatin (ZOCOR) 40 MG tablet Take by mouth.  . sucralfate (CARAFATE) 1 g tablet TAKE ONE TABLET BY MOUTH FOUR TIMES A DAY BEFORE  MEALS AND NIGHTLY  . tamoxifen (NOLVADEX) 20 MG tablet Take 1 tablet by mouth daily.  . tranexamic acid (LYSTEDA) 650 MG TABS tablet Take 1,300 mg by mouth 3 (three) times daily.    Past Medical History:  Diagnosis Date  . Breast cancer ([place])   . Cancer ([place])    skin ca  . Diabetes mellitus without complication ([place])   . Glaucoma   . Hypertension   . Renal disorder   . Von Willebrand disease, type IIa ([place])     Past Surgical  History:  Procedure Laterality Date  . BREAST BIOPSY Right 01/12/2016   path pending/stereo affirm  . OOPHORECTOMY Left     Social History Social History   Tobacco Use  . Smoking status: Never Smoker  . Smokeless tobacco: Never Used  Substance Use Topics  . Alcohol use: Not Currently  . Drug use: Not Currently    Family History Family History  Problem Relation Age of Onset  . Breast cancer Maternal Aunt 87       2 mat aunts  . Breast cancer Cousin   . Breast cancer Other   No family history of bleeding/clotting disorders, porphyria or autoimmune disease   Allergies  Allergen Reactions  . Aspirin Other (See Comments)    Von Willebrand's disease Contraindication due to bleeding disorder Von Willebrand's disease   . Desmopressin Other (See Comments)    with injectable ddavp Bad headache with injectable ddavp with injectable ddavp   . Ibuprofen Other (See Comments)    Von Willebrand's disease NSAIDS contraindicated with bleeding disorder.  Von Willebrand's disease   . Other Other (See Comments)     Von Willebrand's disease  NSAIDS contraindicated with bleeding disorder.  Other reaction(s): Other (See Comments)  Von Willebrand's disease       REVIEW OF SYSTEMS (Negative unless checked)  Constitutional: [] Weight loss  [] Fever  [] Chills Cardiac: [] Chest pain   [] Chest pressure   [] Palpitations   [] Shortness of breath when laying flat   [] Shortness of breath with exertion. Vascular:  [] Pain in legs with walking   [x] Pain in legs at rest  [] History of DVT   [] Phlebitis   [x] Swelling in legs   [x] Varicose veins   [] Non-healing ulcers Pulmonary:   [] Uses home oxygen   [] Productive cough   [] Hemoptysis   [] Wheeze  [] COPD   [] Asthma Neurologic:  [] Dizziness   [] Seizures   [] History of stroke   [] History of TIA  [] Aphasia   [] Vissual changes   [] Weakness or numbness in arm   [] Weakness or numbness in leg Musculoskeletal:   [] Joint swelling   [] Joint pain   [] Low back  pain Hematologic:  [] Easy bruising  [] Easy bleeding   [] Hypercoagulable state   [] Anemic Gastrointestinal:  [] Diarrhea   [] Vomiting  [] Gastroesophageal reflux/heartburn   [] Difficulty swallowing. Genitourinary:  [] Chronic kidney disease   [] Difficult urination  [] Frequent urination   [] Blood in urine Skin:  [] Rashes   [] Ulcers  Psychological:  [] History of anxiety   []  History of major depression.  Physical Examination  Vitals:   06/18/20 1547  BP: (!) 154/84  Pulse: 71  Weight: 232 lb (105.2 kg)  Height: 5' 6"  (1.676 m)   Body mass index is 37.45 kg/m. Gen: WD/WN, NAD Head: [place]/AT, No temporalis wasting.  Ear/Nose/Throat: Hearing grossly intact, nares w/o erythema or drainage, poor dentition Eyes: PER, EOMI, sclera nonicteric.  Neck: Supple, no masses.  No bruit or JVD.  Pulmonary:  Good air movement, clear to auscultation bilaterally,  no use of accessory muscles.  Cardiac: RRR, normal S1, S2, no Murmurs. Vascular: scattered varicosities present bilaterally.  Mild venous stasis changes to the legs bilaterally.  3+ soft pitting edema. Vessel Right Left  Radial Palpable Palpable  PT Palpable Palpable  DP Palpable Palpable  Gastrointestinal: soft, non-distended. No guarding/no peritoneal signs.  Musculoskeletal: M/S 5/5 throughout.  No deformity or atrophy.  Neurologic: CN 2-12 intact. Pain and light touch intact in extremities.  Symmetrical.  Speech is fluent. Motor exam as listed above. Psychiatric: Judgment intact, Mood & affect appropriate for pt's clinical situation. Dermatologic: Mild venous rashes no ulcers noted.  No changes consistent with cellulitis. Lymph : No  lichenification or skin changes of chronic lymphedema.  CBC Lab Results  Component Value Date   WBC 10.2 10/16/2013   HGB 13.4 10/16/2013   HCT 42.1 10/16/2013   MCV 87 10/16/2013   PLT 167 10/16/2013    BMET    Component Value Date/Time   NA 134 (L) 10/16/2013 2026   K 4.1 10/16/2013 2026   CL 104  10/16/2013 2026   CO2 22 10/16/2013 2026   GLUCOSE 441 (H) 10/16/2013 2026   BUN 22 (H) 10/16/2013 2026   CREATININE 1.40 (H) 05/02/2017 1004   CREATININE 0.98 10/16/2013 2026   CALCIUM 9.1 10/16/2013 2026   GFRNONAA >60 10/16/2013 2026   GFRNONAA >60 04/04/2012 0940   GFRAA >60 10/16/2013 2026   GFRAA >60 04/04/2012 0940   CrCl cannot be calculated (Patient's most recent lab result is older than the maximum 21 days allowed.).  COAG Lab Results  Component Value Date   INR 1.0 06/17/2011    Radiology No results found.   Assessment/Plan 1. Pain and swelling of lower leg, unspecified laterality Recommend:  No surgery or intervention at this point in time.    I have reviewed my previous discussion with the patient regarding swelling and why it causes symptoms.  Patient will continue wearing graduated compression stockings class 1 (20-30 mmHg) on a daily basis. The patient will  beginning wearing the stockings first thing in the morning and removing them in the evening. The patient is instructed specifically not to sleep in the stockings.    In addition, behavioral modification including several periods of elevation of the lower extremities during the day will be continued.  This was reviewed with the patient during the initial visit.  The patient will also continue routine exercise, especially walking on a daily basis as was discussed during the initial visit.    Despite conservative treatments including graduated compression therapy class 1 and behavioral modification including exercise and elevation the patient  has not obtained adequate control of the lymphedema.  The patient still has stage 3 lymphedema and therefore, I believe that a lymph pump should be added to improve the control of the patient's lymphedema.  Additionally, a lymph pump is warranted because it will reduce the risk of cellulitis and ulceration in the future.  Patient should follow-up in six months    2.  Lymphedema Recommend:  No surgery or intervention at this point in time.    I have reviewed my previous discussion with the patient regarding swelling and why it causes symptoms.  Patient will continue wearing graduated compression stockings class 1 (20-30 mmHg) on a daily basis. The patient will  beginning wearing the stockings first thing in the morning and removing them in the evening. The patient is instructed specifically not to sleep in the stockings.    In  addition, behavioral modification including several periods of elevation of the lower extremities during the day will be continued.  This was reviewed with the patient during the initial visit.  The patient will also continue routine exercise, especially walking on a daily basis as was discussed during the initial visit.    Despite conservative treatments including graduated compression therapy class 1 and behavioral modification including exercise and elevation the patient  has not obtained adequate control of the lymphedema.  The patient still has stage 3 lymphedema and therefore, I believe that a lymph pump should be added to improve the control of the patient's lymphedema.  Additionally, a lymph pump is warranted because it will reduce the risk of cellulitis and ulceration in the future.  Patient should follow-up in six months    3. Essential hypertension Continue antihypertensive medications as already ordered, these medications have been reviewed and there are no changes at this time.   4. Mixed hyperlipidemia Continue statin as ordered and reviewed, no changes at this time   5. Type 2 diabetes mellitus with other specified complication, unspecified whether long term insulin use (HCC) Continue hypoglycemic medications as already ordered, these medications have been reviewed and there are no changes at this time.  Hgb A1C to be monitored as already arranged by primary service     Hortencia Pilar, MD  06/22/2020 12:56  PM

## 2020-08-03 ENCOUNTER — Encounter (INDEPENDENT_AMBULATORY_CARE_PROVIDER_SITE_OTHER): Payer: Self-pay

## 2020-12-09 NOTE — Progress Notes (Signed)
MRN : [id number]  [name] is a 65 y.o. (Jan 28, 1955) female who presents with chief complaint of check swelling.  History of Present Illness:   The patient returns to the office for followup evaluation regarding leg swelling.  The swelling has persisted but with the lymph pump is much, much better controlled. The pain associated with swelling is essentially eliminated. There have not been any interval development of a ulcerations or wounds.  The patient denies problems with the pump, noting it is working well and the leggings are in good condition.  Since the previous visit the patient has been wearing graduated compression stockings and using the lymph pump on a routine basis and  has noted significant improvement in the lymphedema.   Patient stated the lymph pump has been a very positive factor in her care.    Previous duplex ultrasound of the venous system at an outside institution was negative for reflux, no deep venous obstruction  No outpatient medications have been marked as taking for the 12/10/20 encounter (Appointment) with Delana Meyer, Dolores Lory, MD.    Past Medical History:  Diagnosis Date   Breast cancer ([place])    Cancer ([place])    skin ca   Diabetes mellitus without complication ([place])    Glaucoma    Hypertension    Renal disorder    Von Willebrand disease, type IIa ([place])     Past Surgical History:  Procedure Laterality Date   BREAST BIOPSY Right 01/12/2016   path pending/stereo affirm   OOPHORECTOMY Left     Social History Social History   Tobacco Use   Smoking status: Never   Smokeless tobacco: Never  Substance Use Topics   Alcohol use: Not Currently   Drug use: Not Currently    Family History Family History  Problem Relation Age of Onset   Breast cancer Maternal Aunt 7       2 mat aunts   Breast cancer Cousin    Breast cancer Other     Allergies  Allergen Reactions   Aspirin Other (See Comments)    Von Willebrand's  disease Contraindication due to bleeding disorder Von Willebrand's disease    Desmopressin Other (See Comments)    with injectable ddavp Bad headache with injectable ddavp with injectable ddavp    Ibuprofen Other (See Comments)    Von Willebrand's disease NSAIDS contraindicated with bleeding disorder.  Von Willebrand's disease    Other Other (See Comments)     Von Willebrand's disease  NSAIDS contraindicated with bleeding disorder.  Other reaction(s): Other (See Comments)  Von Willebrand's disease       REVIEW OF SYSTEMS (Negative unless checked)  Constitutional: [] Weight loss  [] Fever  [] Chills Cardiac: [] Chest pain   [] Chest pressure   [] Palpitations   [] Shortness of breath when laying flat   [] Shortness of breath with exertion. Vascular:  [] Pain in legs with walking   [] Pain in legs at rest  [] History of DVT   [] Phlebitis   [x] Swelling in legs   [] Varicose veins   [] Non-healing ulcers Pulmonary:   [] Uses home oxygen   [] Productive cough   [] Hemoptysis   [] Wheeze  [] COPD   [] Asthma Neurologic:  [] Dizziness   [] Seizures   [] History of stroke   [] History of TIA  [] Aphasia   [] Vissual changes   [] Weakness or numbness in arm   [] Weakness or numbness in leg Musculoskeletal:   [] Joint swelling   [] Joint pain   [] Low back pain Hematologic:  [] Easy bruising  [] Easy  bleeding   [] Hypercoagulable state   [] Anemic Gastrointestinal:  [] Diarrhea   [] Vomiting  [] Gastroesophageal reflux/heartburn   [] Difficulty swallowing. Genitourinary:  [] Chronic kidney disease   [] Difficult urination  [] Frequent urination   [] Blood in urine Skin:  [] Rashes   [] Ulcers  Psychological:  [] History of anxiety   []  History of major depression.  Physical Examination  There were no vitals filed for this visit. There is no height or weight on file to calculate BMI. Gen: WD/WN, NAD Head: [place]/AT, No temporalis wasting.  Ear/Nose/Throat: Hearing grossly intact, nares w/o erythema or drainage, pinna without  lesions Eyes: PER, EOMI, sclera nonicteric.  Neck: Supple, no gross masses.  No JVD.  Pulmonary:  Good air movement, no audible wheezing, no use of accessory muscles.  Cardiac: RRR, precordium not hyperdynamic. Vascular:  scattered varicosities present bilaterally.  Mild venous stasis changes to the legs bilaterally.  2+ soft pitting edema  Vessel Right Left  Radial Palpable Palpable  Gastrointestinal: soft, non-distended. No guarding/no peritoneal signs.  Musculoskeletal: M/S 5/5 throughout.  No deformity.  Neurologic: CN 2-12 intact. Pain and light touch intact in extremities.  Symmetrical.  Speech is fluent. Motor exam as listed above. Psychiatric: Judgment intact, Mood & affect appropriate for pt's clinical situation. Dermatologic: Venous rashes no ulcers noted.  No changes consistent with cellulitis. Lymph : No lichenification or skin changes of chronic lymphedema.  CBC Lab Results  Component Value Date   WBC 10.2 10/16/2013   HGB 13.4 10/16/2013   HCT 42.1 10/16/2013   MCV 87 10/16/2013   PLT 167 10/16/2013    BMET    Component Value Date/Time   NA 134 (L) 10/16/2013 2026   K 4.1 10/16/2013 2026   CL 104 10/16/2013 2026   CO2 22 10/16/2013 2026   GLUCOSE 441 (H) 10/16/2013 2026   BUN 22 (H) 10/16/2013 2026   CREATININE 1.40 (H) 05/02/2017 1004   CREATININE 0.98 10/16/2013 2026   CALCIUM 9.1 10/16/2013 2026   GFRNONAA >60 10/16/2013 2026   GFRNONAA >60 04/04/2012 0940   GFRAA >60 10/16/2013 2026   GFRAA >60 04/04/2012 0940   CrCl cannot be calculated (Patient's most recent lab result is older than the maximum 21 days allowed.).  COAG Lab Results  Component Value Date   INR 1.0 06/17/2011    Radiology No results found.   Assessment/Plan 1. Lymphedema  No surgery or intervention at this point in time.    I have reviewed my discussion with the patient regarding lymphedema and why it  causes symptoms.  Patient will continue wearing graduated compression  stockings class 1 (20-30 mmHg) on a daily basis a prescription was given. The patient is reminded to put the stockings on first thing in the morning and removing them in the evening. The patient is instructed specifically not to sleep in the stockings.   In addition, behavioral modification throughout the day will be continued.  This will include frequent elevation (such as in a recliner), use of over the counter pain medications as needed and exercise such as walking.  I have reviewed systemic causes for chronic edema such as liver, kidney and cardiac etiologies and there does not appear to be any significant changes in these organ systems over the past year.  The patient is under the impression that these organ systems are all stable and unchanged.    The patient will continue aggressive use of the  lymph pump.  This will continue to improve the edema control and prevent sequela such  as ulcers and infections.   The patient will follow-up with me on an annual basis.    2. Pain and swelling of lower leg, unspecified laterality  No surgery or intervention at this point in time.    I have reviewed my discussion with the patient regarding lymphedema and why it  causes symptoms.  Patient will continue wearing graduated compression stockings class 1 (20-30 mmHg) on a daily basis a prescription was given. The patient is reminded to put the stockings on first thing in the morning and removing them in the evening. The patient is instructed specifically not to sleep in the stockings.   In addition, behavioral modification throughout the day will be continued.  This will include frequent elevation (such as in a recliner), use of over the counter pain medications as needed and exercise such as walking.  I have reviewed systemic causes for chronic edema such as liver, kidney and cardiac etiologies and there does not appear to be any significant changes in these organ systems over the past year.  The patient is  under the impression that these organ systems are all stable and unchanged.    The patient will continue aggressive use of the  lymph pump.  This will continue to improve the edema control and prevent sequela such as ulcers and infections.   The patient will follow-up with me on an annual basis.    3. Essential hypertension Continue antihypertensive medications as already ordered, these medications have been reviewed and there are no changes at this time.   4. Mixed hyperlipidemia Continue statin as ordered and reviewed, no changes at this time   5. Type 2 diabetes mellitus with other specified complication, unspecified whether long term insulin use (HCC) Continue hypoglycemic medications as already ordered, these medications have been reviewed and there are no changes at this time.  Hgb A1C to be monitored as already arranged by primary service     Hortencia Pilar, MD  12/09/2020 11:09 AM

## 2020-12-10 ENCOUNTER — Ambulatory Visit (INDEPENDENT_AMBULATORY_CARE_PROVIDER_SITE_OTHER): Payer: 59 | Admitting: Vascular Surgery

## 2020-12-10 ENCOUNTER — Encounter (INDEPENDENT_AMBULATORY_CARE_PROVIDER_SITE_OTHER): Payer: Self-pay | Admitting: Vascular Surgery

## 2020-12-10 ENCOUNTER — Other Ambulatory Visit: Payer: Self-pay

## 2020-12-10 VITALS — BP 144/75 | HR 71 | Ht 66.0 in | Wt 241.0 lb

## 2020-12-10 DIAGNOSIS — I890 Lymphedema, not elsewhere classified: Secondary | ICD-10-CM | POA: Diagnosis not present

## 2020-12-10 DIAGNOSIS — M79669 Pain in unspecified lower leg: Secondary | ICD-10-CM | POA: Diagnosis not present

## 2020-12-10 DIAGNOSIS — E782 Mixed hyperlipidemia: Secondary | ICD-10-CM

## 2020-12-10 DIAGNOSIS — I10 Essential (primary) hypertension: Secondary | ICD-10-CM | POA: Diagnosis not present

## 2020-12-10 DIAGNOSIS — M7989 Other specified soft tissue disorders: Secondary | ICD-10-CM

## 2020-12-10 DIAGNOSIS — E1169 Type 2 diabetes mellitus with other specified complication: Secondary | ICD-10-CM

## 2020-12-13 ENCOUNTER — Encounter (INDEPENDENT_AMBULATORY_CARE_PROVIDER_SITE_OTHER): Payer: Self-pay | Admitting: Vascular Surgery

## 2021-01-28 ENCOUNTER — Encounter (INDEPENDENT_AMBULATORY_CARE_PROVIDER_SITE_OTHER): Payer: Self-pay

## 2021-01-28 ENCOUNTER — Ambulatory Visit (INDEPENDENT_AMBULATORY_CARE_PROVIDER_SITE_OTHER): Payer: Medicare Other | Admitting: Vascular Surgery

## 2021-11-22 ENCOUNTER — Encounter (INDEPENDENT_AMBULATORY_CARE_PROVIDER_SITE_OTHER): Payer: Self-pay

## 2023-07-05 ENCOUNTER — Other Ambulatory Visit: Payer: Self-pay

## 2023-07-05 ENCOUNTER — Emergency Department

## 2023-07-05 ENCOUNTER — Inpatient Hospital Stay
Admission: EM | Admit: 2023-07-05 | Discharge: 2023-07-08 | DRG: 398 | Disposition: A | Discharge status: Home / Self Care | Source: Ambulatory Visit | Attending: Osteopathic Medicine | Admitting: Osteopathic Medicine

## 2023-07-05 ENCOUNTER — Encounter
Admission: EM | Disposition: A | Discharge status: Home / Self Care | Payer: Self-pay | Source: Ambulatory Visit | Attending: Osteopathic Medicine

## 2023-07-05 ENCOUNTER — Emergency Department: Admitting: Certified Registered"

## 2023-07-05 DIAGNOSIS — K3532 Acute appendicitis with perforation and localized peritonitis, without abscess: Secondary | ICD-10-CM | POA: Diagnosis not present

## 2023-07-05 DIAGNOSIS — K3580 Unspecified acute appendicitis: Secondary | ICD-10-CM | POA: Diagnosis present

## 2023-07-05 DIAGNOSIS — Z79810 Long term (current) use of selective estrogen receptor modulators (SERMs): Secondary | ICD-10-CM | POA: Diagnosis not present

## 2023-07-05 DIAGNOSIS — Z853 Personal history of malignant neoplasm of breast: Secondary | ICD-10-CM

## 2023-07-05 DIAGNOSIS — Z85828 Personal history of other malignant neoplasm of skin: Secondary | ICD-10-CM

## 2023-07-05 DIAGNOSIS — Z90721 Acquired absence of ovaries, unilateral: Secondary | ICD-10-CM

## 2023-07-05 DIAGNOSIS — Z6839 Body mass index (BMI) 39.0-39.9, adult: Secondary | ICD-10-CM | POA: Diagnosis not present

## 2023-07-05 DIAGNOSIS — I700 Atherosclerosis of aorta: Secondary | ICD-10-CM | POA: Diagnosis present

## 2023-07-05 DIAGNOSIS — K8020 Calculus of gallbladder without cholecystitis without obstruction: Secondary | ICD-10-CM | POA: Diagnosis present

## 2023-07-05 DIAGNOSIS — I10 Essential (primary) hypertension: Secondary | ICD-10-CM | POA: Diagnosis present

## 2023-07-05 DIAGNOSIS — E66812 Obesity, class 2: Secondary | ICD-10-CM | POA: Diagnosis present

## 2023-07-05 DIAGNOSIS — K219 Gastro-esophageal reflux disease without esophagitis: Secondary | ICD-10-CM | POA: Diagnosis present

## 2023-07-05 DIAGNOSIS — Z79890 Hormone replacement therapy (postmenopausal): Secondary | ICD-10-CM

## 2023-07-05 DIAGNOSIS — D68020 Von Willebrand disease, type 2a: Secondary | ICD-10-CM | POA: Diagnosis present

## 2023-07-05 DIAGNOSIS — Z886 Allergy status to analgesic agent status: Secondary | ICD-10-CM

## 2023-07-05 DIAGNOSIS — D6959 Other secondary thrombocytopenia: Secondary | ICD-10-CM | POA: Diagnosis present

## 2023-07-05 DIAGNOSIS — Z79899 Other long term (current) drug therapy: Secondary | ICD-10-CM

## 2023-07-05 DIAGNOSIS — E119 Type 2 diabetes mellitus without complications: Secondary | ICD-10-CM | POA: Diagnosis present

## 2023-07-05 DIAGNOSIS — E039 Hypothyroidism, unspecified: Secondary | ICD-10-CM | POA: Diagnosis present

## 2023-07-05 DIAGNOSIS — E785 Hyperlipidemia, unspecified: Secondary | ICD-10-CM | POA: Diagnosis present

## 2023-07-05 DIAGNOSIS — Z7984 Long term (current) use of oral hypoglycemic drugs: Secondary | ICD-10-CM

## 2023-07-05 DIAGNOSIS — K7460 Unspecified cirrhosis of liver: Secondary | ICD-10-CM | POA: Diagnosis present

## 2023-07-05 DIAGNOSIS — Z4801 Encounter for change or removal of surgical wound dressing: Secondary | ICD-10-CM | POA: Diagnosis present

## 2023-07-05 DIAGNOSIS — K381 Appendicular concretions: Secondary | ICD-10-CM | POA: Diagnosis present

## 2023-07-05 HISTORY — PX: XI ROBOTIC LAPAROSCOPIC ASSISTED APPENDECTOMY: SHX6877

## 2023-07-05 HISTORY — DX: Acute appendicitis with perforation, localized peritonitis, and gangrene, without abscess: K35.32

## 2023-07-05 LAB — COMPREHENSIVE METABOLIC PANEL WITH GFR
ALT: 11 U/L (ref 0–44)
AST: 15 U/L (ref 15–41)
Albumin: 3.4 g/dL — ABNORMAL LOW (ref 3.5–5.0)
Alkaline Phosphatase: 59 U/L (ref 38–126)
Anion gap: 10 (ref 5–15)
BUN: 12 mg/dL (ref 8–23)
CO2: 26 mmol/L (ref 22–32)
Calcium: 9 mg/dL (ref 8.9–10.3)
Chloride: 102 mmol/L (ref 98–111)
Creatinine, Ser: 0.77 mg/dL (ref 0.44–1.00)
GFR, Estimated: >60 mL/min (ref >60)
Glucose, Bld: 159 mg/dL — ABNORMAL HIGH (ref 70–99)
Potassium: 4.1 mmol/L (ref 3.5–5.1)
Sodium: 138 mmol/L (ref 135–145)
Total Bilirubin: 1 mg/dL (ref 0.0–1.2)
Total Protein: 7.6 g/dL (ref 6.5–8.1)

## 2023-07-05 LAB — URINALYSIS, ROUTINE W REFLEX MICROSCOPIC
Bilirubin Urine: NEGATIVE
Glucose, UA: NEGATIVE mg/dL
Ketones, ur: NEGATIVE mg/dL
Nitrite: NEGATIVE
Protein, ur: 30 mg/dL — AB
Specific Gravity, Urine: 1.024 (ref 1.005–1.030)
pH: 5 (ref 5.0–8.0)

## 2023-07-05 LAB — CBC
HCT: 43.8 % (ref 36.0–46.0)
Hemoglobin: 14.4 g/dL (ref 12.0–15.0)
MCH: 31.2 pg (ref 26.0–34.0)
MCHC: 32.9 g/dL (ref 30.0–36.0)
MCV: 95 fL (ref 80.0–100.0)
Platelets: 83 10*3/uL — ABNORMAL LOW (ref 150–400)
RBC: 4.61 MIL/uL (ref 3.87–5.11)
RDW: 13.8 % (ref 11.5–15.5)
WBC: 13.7 10*3/uL — ABNORMAL HIGH (ref 4.0–10.5)
nRBC: 0 % (ref 0.0–0.2)

## 2023-07-05 LAB — TYPE AND SCREEN
ABO/RH(D): A POS
Antibody Screen: NEGATIVE

## 2023-07-05 LAB — GLUCOSE, CAPILLARY
Glucose-Capillary: 184 mg/dL — ABNORMAL HIGH (ref 70–99)
Glucose-Capillary: 181 mg/dL — ABNORMAL HIGH (ref 70–99)

## 2023-07-05 LAB — LIPASE, BLOOD: Lipase: 27 U/L (ref 11–51)

## 2023-07-05 LAB — PROTIME-INR
INR: 1.2 (ref 0.8–1.2)
Prothrombin Time: 15.5 s — ABNORMAL HIGH (ref 11.4–15.2)

## 2023-07-05 LAB — CBG MONITORING, ED: Glucose-Capillary: 137 mg/dL — ABNORMAL HIGH (ref 70–99)

## 2023-07-05 SURGERY — APPENDECTOMY, ROBOT-ASSISTED, LAPAROSCOPIC
Anesthesia: General | Site: Abdomen

## 2023-07-05 MED ORDER — INSULIN ASPART 100 UNIT/ML IJ SOLN
0.0000 [IU] | Freq: Three times a day (TID) | INTRAMUSCULAR | Status: DC
  Administered 2023-07-05: 4 [IU] via SUBCUTANEOUS
  Administered 2023-07-06: 3 [IU] via SUBCUTANEOUS
  Administered 2023-07-06 (×2): 4 [IU] via SUBCUTANEOUS
  Filled 2023-07-05 (×8): qty 1

## 2023-07-05 MED ORDER — TRANEXAMIC ACID 650 MG PO TABS: 1300.0000 mg | ORAL_TABLET | Freq: Three times a day (TID) | ORAL | Status: DC | PRN

## 2023-07-05 MED ORDER — TRAZODONE HCL 50 MG PO TABS: 25.0000 mg | ORAL_TABLET | Freq: Every evening | ORAL | Status: DC | PRN

## 2023-07-05 MED ORDER — DORZOLAMIDE HCL-TIMOLOL MAL 2-0.5 % OP SOLN
1.0000 [drp] | Freq: Two times a day (BID) | OPHTHALMIC | Status: DC
  Administered 2023-07-06 (×2): 1 [drp] via OPHTHALMIC
  Filled 2023-07-05: qty 10

## 2023-07-05 MED ORDER — DROPERIDOL 2.5 MG/ML IJ SOLN: 0.6250 mg | Freq: Once | INTRAMUSCULAR | Status: DC | PRN

## 2023-07-05 MED ORDER — FENTANYL CITRATE PF 50 MCG/ML IJ SOSY
50.0000 ug | PREFILLED_SYRINGE | Freq: Once | INTRAMUSCULAR | Status: AC
  Administered 2023-07-05: 50 ug via INTRAVENOUS
  Filled 2023-07-05: qty 1

## 2023-07-05 MED ORDER — FENTANYL CITRATE (PF) 100 MCG/2ML IJ SOLN
INTRAMUSCULAR | Status: DC | PRN
  Administered 2023-07-05 (×2): 50 ug via INTRAVENOUS

## 2023-07-05 MED ORDER — FENTANYL CITRATE (PF) 100 MCG/2ML IJ SOLN
INTRAMUSCULAR | Status: AC
Start: 2023-07-05 — End: 2023-07-05
  Filled 2023-07-05: qty 2

## 2023-07-05 MED ORDER — SODIUM CHLORIDE 0.9 % IV SOLN: INTRAVENOUS | Status: AC

## 2023-07-05 MED ORDER — IOHEXOL 300 MG/ML  SOLN
100.0000 mL | Freq: Once | INTRAMUSCULAR | Status: AC | PRN
  Administered 2023-07-05: 100 mL via INTRAVENOUS

## 2023-07-05 MED ORDER — DIPHENHYDRAMINE HCL 50 MG/ML IJ SOLN: 12.5000 mg | Freq: Four times a day (QID) | INTRAMUSCULAR | Status: DC | PRN

## 2023-07-05 MED ORDER — BUPIVACAINE LIPOSOME 1.3 % IJ SUSP
INTRAMUSCULAR | Status: AC
  Filled 2023-07-05: qty 20

## 2023-07-05 MED ORDER — LEVOTHYROXINE SODIUM 50 MCG PO TABS
125.0000 ug | ORAL_TABLET | Freq: Every day | ORAL | Status: DC
  Administered 2023-07-06: 125 ug via ORAL
  Filled 2023-07-05 (×3): qty 1

## 2023-07-05 MED ORDER — ONDANSETRON HCL 4 MG/2ML IJ SOLN
4.0000 mg | Freq: Four times a day (QID) | INTRAMUSCULAR | Status: DC | PRN
  Filled 2023-07-05: qty 2

## 2023-07-05 MED ORDER — PROPOFOL 10 MG/ML IV BOLUS
INTRAVENOUS | Status: DC | PRN
  Administered 2023-07-05: 150 mg via INTRAVENOUS

## 2023-07-05 MED ORDER — LATANOPROST 0.005 % OP SOLN
1.0000 [drp] | Freq: Every day | OPHTHALMIC | Status: DC
  Administered 2023-07-06: 1 [drp] via OPHTHALMIC
  Filled 2023-07-05: qty 2.5

## 2023-07-05 MED ORDER — DEXAMETHASONE SODIUM PHOSPHATE 10 MG/ML IJ SOLN
INTRAMUSCULAR | Status: AC
  Filled 2023-07-05: qty 1

## 2023-07-05 MED ORDER — ACETAMINOPHEN 10 MG/ML IV SOLN
INTRAVENOUS | Status: DC | PRN
  Administered 2023-07-05: 1000 mg via INTRAVENOUS

## 2023-07-05 MED ORDER — ONDANSETRON HCL 4 MG/2ML IJ SOLN
INTRAMUSCULAR | Status: AC
  Filled 2023-07-05: qty 2

## 2023-07-05 MED ORDER — HYDROMORPHONE HCL 1 MG/ML IJ SOLN
1.0000 mg | INTRAMUSCULAR | Status: DC | PRN
  Administered 2023-07-07: 1 mg via INTRAVENOUS
  Filled 2023-07-05 (×2): qty 1

## 2023-07-05 MED ORDER — INSULIN ASPART 100 UNIT/ML IJ SOLN: 0.0000 [IU] | Freq: Three times a day (TID) | INTRAMUSCULAR | Status: DC

## 2023-07-05 MED ORDER — ONDANSETRON 4 MG PO TBDP
4.0000 mg | ORAL_TABLET | Freq: Four times a day (QID) | ORAL | Status: DC | PRN
  Administered 2023-07-06 (×2): 4 mg via ORAL
  Filled 2023-07-05 (×2): qty 1

## 2023-07-05 MED ORDER — ACETAMINOPHEN 10 MG/ML IV SOLN
INTRAVENOUS | Status: AC
  Filled 2023-07-05: qty 100

## 2023-07-05 MED ORDER — FENTANYL CITRATE (PF) 100 MCG/2ML IJ SOLN
25.0000 ug | INTRAMUSCULAR | Status: DC | PRN
  Administered 2023-07-05 (×2): 25 ug via INTRAVENOUS

## 2023-07-05 MED ORDER — PIPERACILLIN-TAZOBACTAM 3.375 G IVPB
3.3750 g | Freq: Three times a day (TID) | INTRAVENOUS | Status: DC
Start: 2023-07-05 — End: 2023-07-08
  Administered 2023-07-05 – 2023-07-06 (×4): 3.375 g via INTRAVENOUS
  Filled 2023-07-05 (×8): qty 50

## 2023-07-05 MED ORDER — MIDAZOLAM HCL 2 MG/2ML IJ SOLN
INTRAMUSCULAR | Status: AC
  Filled 2023-07-05: qty 2

## 2023-07-05 MED ORDER — PHENYLEPHRINE 80 MCG/ML (10ML) SYRINGE FOR IV PUSH (FOR BLOOD PRESSURE SUPPORT)
PREFILLED_SYRINGE | INTRAVENOUS | Status: DC | PRN
  Administered 2023-07-05: 80 ug via INTRAVENOUS

## 2023-07-05 MED ORDER — SIMVASTATIN 20 MG PO TABS
40.0000 mg | ORAL_TABLET | Freq: Every day | ORAL | Status: DC
  Administered 2023-07-06: 40 mg via ORAL
  Filled 2023-07-05 (×2): qty 2

## 2023-07-05 MED ORDER — BUPIVACAINE-EPINEPHRINE (PF) 0.25% -1:200000 IJ SOLN
INTRAMUSCULAR | Status: AC
  Filled 2023-07-05: qty 30

## 2023-07-05 MED ORDER — SUCCINYLCHOLINE CHLORIDE 200 MG/10ML IV SOSY
PREFILLED_SYRINGE | INTRAVENOUS | Status: DC | PRN
  Administered 2023-07-05: 100 mg via INTRAVENOUS

## 2023-07-05 MED ORDER — OXYCODONE HCL 5 MG/5ML PO SOLN: 5.0000 mg | Freq: Once | ORAL | Status: DC | PRN

## 2023-07-05 MED ORDER — TAMOXIFEN CITRATE 10 MG PO TABS
20.0000 mg | ORAL_TABLET | Freq: Every day | ORAL | Status: DC
  Filled 2023-07-05 (×3): qty 2

## 2023-07-05 MED ORDER — PROPOFOL 10 MG/ML IV BOLUS
INTRAVENOUS | Status: AC
  Filled 2023-07-05: qty 20

## 2023-07-05 MED ORDER — 0.9 % SODIUM CHLORIDE (POUR BTL) OPTIME
TOPICAL | Status: DC | PRN
  Administered 2023-07-05: 500 mL

## 2023-07-05 MED ORDER — TRANEXAMIC ACID-NACL 1000-0.7 MG/100ML-% IV SOLN
INTRAVENOUS | Status: DC | PRN
  Administered 2023-07-05: 1000 mg via INTRAVENOUS

## 2023-07-05 MED ORDER — TRANEXAMIC ACID-NACL 1000-0.7 MG/100ML-% IV SOLN
INTRAVENOUS | Status: AC
  Filled 2023-07-05: qty 100

## 2023-07-05 MED ORDER — BRIMONIDINE TARTRATE 0.2 % OP SOLN
1.0000 [drp] | Freq: Three times a day (TID) | OPHTHALMIC | Status: DC
  Administered 2023-07-06 (×3): 1 [drp] via OPHTHALMIC
  Filled 2023-07-05: qty 5

## 2023-07-05 MED ORDER — PANTOPRAZOLE SODIUM 40 MG IV SOLR
40.0000 mg | Freq: Every day | INTRAVENOUS | Status: DC
  Administered 2023-07-05 – 2023-07-06 (×2): 40 mg via INTRAVENOUS
  Filled 2023-07-05 (×3): qty 10

## 2023-07-05 MED ORDER — SODIUM CHLORIDE 0.9 % IR SOLN
Status: DC | PRN
  Administered 2023-07-05: 1000 mL
  Administered 2023-07-05: 3000 mL

## 2023-07-05 MED ORDER — LIDOCAINE HCL (CARDIAC) PF 100 MG/5ML IV SOSY
PREFILLED_SYRINGE | INTRAVENOUS | Status: DC | PRN
  Administered 2023-07-05: 60 mg via INTRAVENOUS

## 2023-07-05 MED ORDER — PIPERACILLIN-TAZOBACTAM 3.375 G IVPB 30 MIN
3.3750 g | Freq: Once | INTRAVENOUS | Status: AC
  Administered 2023-07-05: 3.375 g via INTRAVENOUS
  Filled 2023-07-05 (×2): qty 50

## 2023-07-05 MED ORDER — SUGAMMADEX SODIUM 200 MG/2ML IV SOLN
INTRAVENOUS | Status: DC | PRN
  Administered 2023-07-05: 200 mg via INTRAVENOUS

## 2023-07-05 MED ORDER — OXYCODONE-ACETAMINOPHEN 5-325 MG PO TABS
1.0000 | ORAL_TABLET | ORAL | Status: DC | PRN
  Administered 2023-07-06: 2 via ORAL
  Filled 2023-07-05 (×2): qty 2

## 2023-07-05 MED ORDER — ONDANSETRON HCL 4 MG/2ML IJ SOLN
4.0000 mg | Freq: Once | INTRAMUSCULAR | Status: AC
  Administered 2023-07-05: 4 mg via INTRAVENOUS
  Filled 2023-07-05: qty 2

## 2023-07-05 MED ORDER — PHENYLEPHRINE 80 MCG/ML (10ML) SYRINGE FOR IV PUSH (FOR BLOOD PRESSURE SUPPORT)
PREFILLED_SYRINGE | INTRAVENOUS | Status: AC
  Filled 2023-07-05: qty 10

## 2023-07-05 MED ORDER — SUCRALFATE 1 G PO TABS: 1.0000 g | ORAL_TABLET | Freq: Four times a day (QID) | ORAL | Status: DC | PRN

## 2023-07-05 MED ORDER — SUCCINYLCHOLINE CHLORIDE 200 MG/10ML IV SOSY
PREFILLED_SYRINGE | INTRAVENOUS | Status: AC
  Filled 2023-07-05: qty 10

## 2023-07-05 MED ORDER — SODIUM CHLORIDE 0.9 % IV SOLN: INTRAVENOUS | Status: DC | PRN

## 2023-07-05 MED ORDER — HYDRALAZINE HCL 20 MG/ML IJ SOLN: 10.0000 mg | INTRAMUSCULAR | Status: DC | PRN

## 2023-07-05 MED ORDER — ROCURONIUM BROMIDE 10 MG/ML (PF) SYRINGE
PREFILLED_SYRINGE | INTRAVENOUS | Status: AC
  Filled 2023-07-05: qty 10

## 2023-07-05 MED ORDER — DIPHENHYDRAMINE HCL 12.5 MG/5ML PO ELIX: 12.5000 mg | ORAL_SOLUTION | Freq: Four times a day (QID) | ORAL | Status: DC | PRN

## 2023-07-05 MED ORDER — LIDOCAINE HCL (PF) 2 % IJ SOLN
INTRAMUSCULAR | Status: AC
  Filled 2023-07-05: qty 5

## 2023-07-05 MED ORDER — ROCURONIUM BROMIDE 100 MG/10ML IV SOLN
INTRAVENOUS | Status: DC | PRN
  Administered 2023-07-05: 30 mg via INTRAVENOUS
  Administered 2023-07-05 (×2): 10 mg via INTRAVENOUS

## 2023-07-05 MED ORDER — DEXAMETHASONE SODIUM PHOSPHATE 10 MG/ML IJ SOLN
INTRAMUSCULAR | Status: DC | PRN
  Administered 2023-07-05: 5 mg via INTRAVENOUS

## 2023-07-05 MED ORDER — ONDANSETRON HCL 4 MG/2ML IJ SOLN
INTRAMUSCULAR | Status: DC | PRN
  Administered 2023-07-05: 4 mg via INTRAVENOUS

## 2023-07-05 MED ORDER — OXYCODONE HCL 5 MG PO TABS: 5.0000 mg | ORAL_TABLET | Freq: Once | ORAL | Status: DC | PRN

## 2023-07-05 MED ORDER — ACETAMINOPHEN 325 MG PO TABS
650.0000 mg | ORAL_TABLET | ORAL | Status: DC | PRN
  Filled 2023-07-05: qty 2

## 2023-07-05 MED ORDER — BUPIVACAINE-EPINEPHRINE (PF) 0.25% -1:200000 IJ SOLN
INTRAMUSCULAR | Status: DC | PRN
  Administered 2023-07-05: 30 mL

## 2023-07-05 MED ORDER — MIDAZOLAM HCL 2 MG/2ML IJ SOLN
INTRAMUSCULAR | Status: DC | PRN
  Administered 2023-07-05: 2 mg via INTRAVENOUS

## 2023-07-05 MED ORDER — ACETAMINOPHEN 10 MG/ML IV SOLN: 1000.0000 mg | Freq: Once | INTRAVENOUS | Status: DC | PRN

## 2023-07-05 SURGICAL SUPPLY — 53 items
BAG PRESSURE INF REUSE 3000 (BAG) IMPLANT
BLADE CLIPPER SURG (BLADE) IMPLANT
CANNULA REDUCER 12-8 DVNC XI (CANNULA) IMPLANT
COVER TIP SHEARS 8 DVNC (MISCELLANEOUS) ×1 IMPLANT
CUTTER FLEX LINEAR 45M (STAPLE) IMPLANT
DERMABOND ADVANCED .7 DNX12 (GAUZE/BANDAGES/DRESSINGS) ×1 IMPLANT
DRAIN CHANNEL JP 19F RND 3/16 (MISCELLANEOUS) IMPLANT
DRAPE ARM DVNC X/XI (DISPOSABLE) ×3 IMPLANT
DRAPE COLUMN DVNC XI (DISPOSABLE) ×1 IMPLANT
ELECTRODE REM PT RTRN 9FT ADLT (ELECTROSURGICAL) ×1 IMPLANT
EVACUATOR SILICONE 100CC (DRAIN) IMPLANT
FORCEPS BPLR R/ABLATION 8 DVNC (INSTRUMENTS) ×1 IMPLANT
GLOVE ORTHO TXT STRL SZ7.5 (GLOVE) ×2 IMPLANT
GOWN STRL REUS W/ TWL LRG LVL3 (GOWN DISPOSABLE) ×1 IMPLANT
GOWN STRL REUS W/ TWL XL LVL3 (GOWN DISPOSABLE) ×2 IMPLANT
GRASPER SUT TROCAR 14GX15 (MISCELLANEOUS) IMPLANT
GRASPER TIP-UP FEN DVNC XI (INSTRUMENTS) ×1 IMPLANT
IRRIGATION STRYKERFLOW (MISCELLANEOUS) IMPLANT
IRRIGATOR SUCT 8 DISP DVNC XI (IRRIGATION / IRRIGATOR) IMPLANT
IV NS 1000ML BAXH (IV SOLUTION) IMPLANT
KIT PINK PAD W/HEAD ARE REST (MISCELLANEOUS) ×1 IMPLANT
KIT PINK PAD W/HEAD ARM REST (MISCELLANEOUS) ×1 IMPLANT
KIT TURNOVER KIT A (KITS) ×1 IMPLANT
LABEL OR SOLS (LABEL) ×1 IMPLANT
MANIFOLD NEPTUNE II (INSTRUMENTS) ×1 IMPLANT
NDL DRIVE SUT CUT DVNC (INSTRUMENTS) ×1 IMPLANT
NDL HYPO 22X1.5 SAFETY MO (MISCELLANEOUS) ×1 IMPLANT
NDL INSUFFLATION 14GA 120MM (NEEDLE) IMPLANT
NEEDLE DRIVE SUT CUT DVNC (INSTRUMENTS) ×1 IMPLANT
NEEDLE HYPO 22X1.5 SAFETY MO (MISCELLANEOUS) ×1 IMPLANT
NEEDLE INSUFFLATION 14GA 120MM (NEEDLE) ×1 IMPLANT
NS IRRIG 500ML POUR BTL (IV SOLUTION) ×1 IMPLANT
PACK LAP CHOLECYSTECTOMY (MISCELLANEOUS) ×1 IMPLANT
RELOAD 45 VASCULAR/THIN (ENDOMECHANICALS) ×1 IMPLANT
RELOAD STAPLE 45 2.5 WHT DVNC (STAPLE) IMPLANT
RELOAD STAPLE 45 2.5 WHT GRN (ENDOMECHANICALS) IMPLANT
RELOAD STAPLER 2.5X45 WHT DVNC (STAPLE) IMPLANT
SCISSORS LAP 5X35 DISP (ENDOMECHANICALS) IMPLANT
SCISSORS MNPLR CVD DVNC XI (INSTRUMENTS) ×1 IMPLANT
SEAL UNIV 5-12 XI (MISCELLANEOUS) ×3 IMPLANT
SET TUBE SMOKE EVAC HIGH FLOW (TUBING) ×1 IMPLANT
SOL .9 NS 3000ML IRR UROMATIC (IV SOLUTION) IMPLANT
SOLUTION ELECTROSURG ANTI STCK (MISCELLANEOUS) ×1 IMPLANT
SPIKE FLUID TRANSFER (MISCELLANEOUS) ×1 IMPLANT
STAPLER 45 SUREFORM DVNC (STAPLE) IMPLANT
SUT VIC AB 2-0 SH 27XBRD (SUTURE) ×1 IMPLANT
SUT VICRYL 0 UR6 27IN ABS (SUTURE) ×1 IMPLANT
SUTURE MNCRL 4-0 27XMF (SUTURE) ×1 IMPLANT
SYSTEM BAG RETRIEVAL 10MM (BASKET) ×1 IMPLANT
TRAP FLUID SMOKE EVACUATOR (MISCELLANEOUS) ×1 IMPLANT
TRAY FOLEY SLVR 16FR LF STAT (SET/KITS/TRAYS/PACK) ×1 IMPLANT
TROCAR Z-THREAD FIOS 12X100MM (TROCAR) ×1 IMPLANT
WATER STERILE IRR 500ML POUR (IV SOLUTION) ×1 IMPLANT

## 2023-07-05 NOTE — ED Notes (Signed)
 Lab called for blood draw.

## 2023-07-05 NOTE — Transfer of Care (Signed)
 Immediate Anesthesia Transfer of Care Note  Patient: [name]  Procedure(s) Performed: APPENDECTOMY, ROBOT-ASSISTED, LAPAROSCOPIC (Abdomen)  Patient Location: PACU  Anesthesia Type:General  Level of Consciousness: sedated, drowsy, and responds to stimulation   Airway & Oxygen Therapy: Patient Spontanous Breathing and Patient connected to face mask oxygen  Post-op Assessment: Report given to RN and Post -op Vital signs reviewed and stable  Post vital signs: Reviewed and stable  Last Vitals:  Vitals Value Taken Time  BP 144/55 07/05/23 1825  Temp    Pulse 77 07/05/23 1825  Resp 17 07/05/23 1825  SpO2 99 % 07/05/23 1825    Last Pain:  Vitals:   07/05/23 1526  TempSrc: Temporal  PainSc:          Complications: No notable events documented.

## 2023-07-05 NOTE — Anesthesia Preprocedure Evaluation (Signed)
 Anesthesia Evaluation  Patient identified by MRN, date of birth, ID band Patient awake    Reviewed: Allergy & Precautions, H&P , NPO status , Patient's Chart, lab work & pertinent test results, reviewed documented beta blocker date and time   Airway Mallampati: II  TM Distance: >3 FB Neck ROM: full    Dental  (+) Teeth Intact   Pulmonary neg pulmonary ROS   Pulmonary exam normal        Cardiovascular Exercise Tolerance: Good hypertension, On Medications negative cardio ROS Normal cardiovascular exam Rhythm:regular Rate:Normal     Neuro/Psych negative neurological ROS  negative psych ROS   GI/Hepatic negative GI ROS, Neg liver ROS,,,  Endo/Other  negative endocrine ROSdiabetes    Renal/GU Renal disease  negative genitourinary   Musculoskeletal   Abdominal   Peds  Hematology negative hematology ROS (+)   Anesthesia Other Findings Past Medical History: No date: Breast cancer (HCC) No date: Cancer (HCC)     Comment:  skin ca No date: Diabetes mellitus without complication (HCC) No date: Glaucoma No date: Hypertension No date: Renal disorder No date: Von Willebrand disease, type IIa (HCC) Past Surgical History: 01/12/2016: BREAST BIOPSY; Right     Comment:  path pending/stereo affirm No date: OOPHORECTOMY; Left BMI    Body Mass Index: 39.06 kg/m     Reproductive/Obstetrics negative OB ROS                             Anesthesia Physical Anesthesia Plan  ASA: 2 and emergent  Anesthesia Plan: General ETT   Post-op Pain Management:    Induction:   PONV Risk Score and Plan: 4 or greater  Airway Management Planned:   Additional Equipment:   Intra-op Plan:   Post-operative Plan:   Informed Consent: I have reviewed the patients History and Physical, chart, labs and discussed the procedure including the risks, benefits and alternatives for the proposed anesthesia with the  patient or authorized representative who has indicated his/her understanding and acceptance.     Dental Advisory Given  Plan Discussed with: CRNA  Anesthesia Plan Comments:        Anesthesia Quick Evaluation

## 2023-07-05 NOTE — ED Triage Notes (Signed)
 First nurse note: Pt to ED via POV from [facility]. Pt reports lower abd pain that radiates to pelvis. Tender to touch. No diarrhea or urinary symptoms. Temp of 100.1 x2 days.

## 2023-07-05 NOTE — Consult Note (Signed)
 Initial Consultation Note   [place]  Patient: [name]:[id number] DOB: [birth year] PCP: Lyle San, MD DOA: 07/05/2023 DOS: the patient was seen and examined on 07/05/2023 Primary service: Flynn Hylan, MD   PRIMARY CARE PHYSICIAN: Lyle San, MD   Patient is coming from: Home.  REQUESTING/REFERRING PHYSICIAN: Flynn Hylan, MD  CHIEF COMPLAINT:   Chief Complaint  Patient presents with   Abdominal Pain    HISTORY OF PRESENT ILLNESS:  [name] is a 68 y.o. female with medical history significant for type 2 diabetes mellitus, Von Willebrand disease type IIa and essential hypertension, who presented to the emergency room with acute onset of lower abdominal pain started on Monday morning with associated fever and chills.  She has been having anorexia with nausea without vomiting.  She has not had any bowel movements since her pain started.  She denies any chest pain or palpitations.  No cough or wheezing or dyspnea.  No dysuria, hematuria, urinary frequency or urgency or flank pain.  No current fever or chills.  ED Course: When she came to the ER BP was 151/62 with otherwise normal vital signs.  Latest postop vital signs were within normal.  Labs revealed a blood glucose of 159 with albumin of 3.4 with otherwise unremarkable CMP.  CBC showed leukocytosis 13.7 and thrombocytopenia of 83.  INR was 1.2 and PT 15.5.  Blood group was A+ with negative antibody screen. EKG as reviewed by me : None. Imaging: Abdominal pelvic CT scan revealed acute appendicitis with appendicolith noted.  There was no definite abscess noted.  It showed hepatic cirrhosis, cholelithiasis and aortic atherosclerosis.  The patient was placed on IV Zosyn and was given 40 mg of IV Zofran and 50 mcg of IV fentanyl.  She underwent appendectomy by Dr. Ofilia Benton.  We were consulted for medical evaluation and postoperative management. PAST MEDICAL HISTORY:   Past Medical History:  Diagnosis  Date   Breast cancer (HCC)    Cancer (HCC)    skin ca   Diabetes mellitus without complication (HCC)    Glaucoma    Hypertension    Renal disorder    Von Willebrand disease, type IIa (HCC)     PAST SURGICAL HISTORY:   Past Surgical History:  Procedure Laterality Date   BREAST BIOPSY Right 01/12/2016   path pending/stereo affirm   OOPHORECTOMY Left     SOCIAL HISTORY:   Social History   Tobacco Use   Smoking status: Never   Smokeless tobacco: Never  Substance Use Topics   Alcohol use: Not Currently    FAMILY HISTORY:   Family History  Problem Relation Age of Onset   Breast cancer Maternal Aunt 55       2 mat aunts   Breast cancer Cousin    Breast cancer Other     DRUG ALLERGIES:   Allergies  Allergen Reactions   Aspirin Other (See Comments)    Von Willebrand's disease Contraindication due to bleeding disorder Von Willebrand's disease    Desmopressin Other (See Comments)    with injectable ddavp Bad headache with injectable ddavp with injectable ddavp    Ibuprofen Other (See Comments)    Von Willebrand's disease NSAIDS contraindicated with bleeding disorder.  Von Willebrand's disease    Other Other (See Comments)    Von Willebrand's disease   NSAIDS contraindicated with bleeding disorder.  Other reaction(s): Other (See Comments)   Von Willebrand's disease  Von Willebrand's disease  NSAIDS contraindicated with bleeding disorder.  Other  reaction(s): Other (See Comments)  Von Willebrand's disease    REVIEW OF SYSTEMS:   ROS As per history of present illness. All pertinent systems were reviewed above. Constitutional, HEENT, cardiovascular, respiratory, GI, GU, musculoskeletal, neuro, psychiatric, endocrine, integumentary and hematologic systems were reviewed and are otherwise negative/unremarkable except for positive findings mentioned above in the HPI.   MEDICATIONS AT HOME:   Prior to Admission medications   Medication Sig Start Date End  Date Taking? Authorizing Provider  Acetaminophen 650 MG/20.3ML SUSP Take 1 tablet by mouth daily as needed.   Yes [provider]  ACETAMINOPHEN 8 HOUR PO Take 1 tablet by mouth daily.   Yes [provider]  antihemophilic factor-vwf (HUMATE-P) [phone] units SOLR injection Infuse Humate P 4130 RCOunits (+/-10%) IVP 3x weekly on M-W-F and Q12-24 hours prn bleeding as directed 09/23/13  Yes [provider]  bimatoprost (LUMIGAN) 0.01 % SOLN Apply to eye. 11/13/17  Yes [provider]  brimonidine (ALPHAGAN) 0.2 % ophthalmic solution Apply to eye. 06/30/17  Yes [provider]  dorzolamide-timolol (COSOPT) 22.3-6.8 MG/ML ophthalmic solution Apply to eye. 11/02/17  Yes [provider]  furosemide (LASIX) 20 MG tablet Take 20 mg by mouth daily. 06/08/20  Yes [provider]  glimepiride (AMARYL) 4 MG tablet Take by mouth. 11/25/19  Yes [provider]  levothyroxine (SYNTHROID) 125 MCG tablet Take by mouth. 02/17/20  Yes [provider]  meclizine (ANTIVERT) 25 MG tablet Take 1 tablet by mouth every 6 (six) hours as needed. 07/25/16  Yes [provider]  ondansetron (ZOFRAN) 4 MG tablet Take by mouth. 07/25/16  Yes [provider]  pioglitazone-metformin (ACTOPLUS MET) 15-500 MG tablet Take by mouth. 12/11/17  Yes [provider]  simvastatin (ZOCOR) 40 MG tablet Take by mouth. 06/23/11  Yes [provider]  tamoxifen (NOLVADEX) 20 MG tablet Take 1 tablet by mouth daily. 12/16/19  Yes [provider]  tranexamic acid (LYSTEDA) 650 MG TABS tablet Take 1,300 mg by mouth 3 (three) times daily. 04/15/20  Yes [provider]  glucose blood (ACCU-CHEK AVIVA PLUS) test strip Use 2 (two) times daily. Use as instructed. 12/08/15   [provider]  glucose blood (PRECISION QID TEST) test strip Use 2 (two) times daily. Use as instructed. 12/08/15   [provider]  Insulin  Pen Needle (NOVOTWIST PEN NEEDLE) 32G X 5 MM MISC USE AS DRECTED 11/14/16   [provider]  liraglutide (VICTOZA) 18 MG/3ML SOPN DIAL AND INJECT UNDER THE SKIN 1.8 MG DAILY *APPT NEEDED* 11/28/18   [provider]  predniSONE  (DELTASONE ) 50 MG tablet Take one 50 mg tablet once daily for the next five days. 11/10/17   Woods, Jaclyn M, PA-C  sucralfate (CARAFATE) 1 g tablet TAKE ONE TABLET BY MOUTH FOUR TIMES A DAY BEFORE MEALS AND NIGHTLY 09/01/16   [provider]      VITAL SIGNS:  Blood pressure 113/82, pulse 83, temperature 98.8 F (37.1 C), temperature source Oral, resp. rate 18, height 5' 6 (1.676 m), weight 109.8 kg, SpO2 97%.  PHYSICAL EXAMINATION:  Physical Exam  GENERAL:  68 y.o.-year-old patient lying in the bed with no acute distress.  EYES: Pupils equal, round, reactive to light and accommodation. No scleral icterus. Extraocular muscles intact.  HEENT: Head atraumatic, normocephalic. Oropharynx and nasopharynx clear.  NECK:  Supple, no jugular venous distention. No thyroid enlargement, no tenderness.  LUNGS: Normal breath sounds bilaterally, no wheezing, rales,rhonchi or crepitation. No use of accessory  muscles of respiration.  CARDIOVASCULAR: Regular rate and rhythm, S1, S2 normal. No murmurs, rubs, or gallops.  ABDOMEN: Soft, nondistended, nontender. Bowel sounds present. No organomegaly or mass.  EXTREMITIES: No pedal edema, cyanosis, or clubbing.  NEUROLOGIC: Cranial nerves II through XII are intact. Muscle strength 5/5 in all extremities. Sensation intact. Gait not checked.  PSYCHIATRIC: The patient is alert and oriented x 3.  Normal affect and good eye contact. SKIN: No obvious rash, lesion, or ulcer.   LABORATORY PANEL:   CBC Recent Labs  Lab 07/05/23 1229  WBC 13.7*  HGB 14.4  HCT 43.8  PLT 83*   ------------------------------------------------------------------------------------------------------------------  Chemistries  Recent  Labs  Lab 07/05/23 1229  NA 138  K 4.1  CL 102  CO2 26  GLUCOSE 159*  BUN 12  CREATININE 0.77  CALCIUM 9.0  AST 15  ALT 11  ALKPHOS 59  BILITOT 1.0   ------------------------------------------------------------------------------------------------------------------  Cardiac Enzymes No results for input(s): TROPONINI in the last 168 hours. ------------------------------------------------------------------------------------------------------------------  RADIOLOGY:  CT ABDOMEN PELVIS W CONTRAST Result Date: 07/05/2023 CLINICAL DATA:  Acute lower abdominal pain. EXAM: CT ABDOMEN AND PELVIS WITH CONTRAST TECHNIQUE: Multidetector CT imaging of the abdomen and pelvis was performed using the standard protocol following bolus administration of intravenous contrast. RADIATION DOSE REDUCTION: This exam was performed according to the departmental dose-optimization program which includes automated exposure control, adjustment of the mA and/or kV according to patient size and/or use of iterative reconstruction technique. CONTRAST:  100mL OMNIPAQUE IOHEXOL 300 MG/ML  SOLN COMPARISON:  March 21, 2005. FINDINGS: Lower chest: No acute abnormality. Hepatobiliary: Cholelithiasis. No biliary dilatation. Nodular hepatic contours are noted suggesting hepatic cirrhosis. Pancreas: Unremarkable. No pancreatic ductal dilatation or surrounding inflammatory changes. Spleen: Normal in size without focal abnormality. Adrenals/Urinary Tract: Adrenal glands are unremarkable. Kidneys are normal, without renal calculi, focal lesion, or hydronephrosis. Bladder is unremarkable. Stomach/Bowel: The stomach is unremarkable. There is no evidence of bowel obstruction. Severe thickening and inflammatory changes are seen involving the appendix with appendicolith present. This is consistent with appendicitis without definite abscess formation. Appendix has maximum measured diameter of 19 mm. Vascular/Lymphatic: Aortic  atherosclerosis. No enlarged abdominal or pelvic lymph nodes. Reproductive: Uterus is unremarkable. No adnexal abnormality is noted. Other: No ascites or hernia is noted. Musculoskeletal: No acute or significant osseous findings. IMPRESSION: Findings consistent with acute appendicitis. Appendicolith is noted. No definite abscess is noted. Hepatic cirrhosis. Cholelithiasis. Aortic Atherosclerosis (ICD10-I70.0). Electronically Signed   By: Rosalene Colon M.D.   On: 07/05/2023 14:35      IMPRESSION AND PLAN:  Assessment and Plan: No notes have been filed under this hospital service. Service: Hospitalist This is a pleasant 68 year old female with acute appendicitis status post appendectomy with:  1.  Type 2 diabetes mellitus without complications. - We will place her on Cipro coverage with NovoLog. - We will hold off her oral antidiabetics for now.  2.  Hypothyroidism. - We will continue Synthroid.  3.  Dyslipidemia. - Would continue statin therapy.  4.  Von Willebrand's disease. - We will continue her tranexamic acid.  5.  GERD without esophagitis. - Will continue her Carafate.  6.  History of breast cancer. - Tamoxifen will be continued.   DVT prophylaxis: Per surgery.  SCDs would be recommended for now and encourage mobility given bleeding risk. Advanced Care Planning:  Code Status: full code. Family Communication:  The plan of care was discussed in details with the patient (and family). I answered all questions. The patient  agreed to proceed with the above mentioned plan. Further management will depend upon hospital course. Disposition Plan: Back to previous home environment Consults called: none.  All the records are reviewed and case discussed with the patient.  Thank you Dr. Ofilia Benton for allowing me to participate in the care of this very pleasant lady.  We will follow the patient along with you.  Status is: Inpatient               Dispo: The patient is from: Home               Anticipated d/c is to: Home              Patient currently is not medically stable to d/c.              Difficult to place patient: No  Virgene Griffin M.D on 07/05/2023 at 9:19 PM  [facility]   From 7 PM-7 AM, contact night-coverage [email or web address]  CC: Primary care physician; Lyle San, MD

## 2023-07-05 NOTE — Anesthesia Procedure Notes (Signed)
 Procedure Name: Intubation Date/Time: 07/05/2023 4:20 PM  Performed by: Orin Birk, CRNAPre-anesthesia Checklist: Patient identified, Emergency Drugs available, Suction available and Patient being monitored Patient Re-evaluated:Patient Re-evaluated prior to induction Oxygen Delivery Method: Circle system utilized Preoxygenation: Pre-oxygenation with 100% oxygen Induction Type: IV induction, Rapid sequence and Cricoid Pressure applied Laryngoscope Size: McGrath and 4 Grade View: Grade I Tube type: Oral Tube size: 6.5 mm Number of attempts: 1 Airway Equipment and Method: Stylet and Video-laryngoscopy Placement Confirmation: ETT inserted through vocal cords under direct vision, positive ETCO2 and breath sounds checked- equal and bilateral Secured at: 21 cm Tube secured with: Tape Dental Injury: Teeth and Oropharynx as per pre-operative assessment

## 2023-07-05 NOTE — Progress Notes (Signed)
 Of noted:  petechiae facial area.

## 2023-07-05 NOTE — H&P (Signed)
 Patient ID: [name], female   DOB: [birth year], 68 y.o.   MRN: [id number]  Chief Complaint:  Lower abdominal pain for 2 days  History of Present Illness [name] is a 68 y.o. female with the above.  She actually reports her pain began on Monday morning, and followed with fevers, chills and remained in bed.  She reports anorexia with nausea, but denies vomiting.  She does not have any bowel movement since the pain began.  She has a history of being a free bleeder with the von Willebrand disease, has medications at home for bleeding issues.  Known history of gallstones, but no postprandial symptoms, denies any history of right upper quadrant pain.  Does not take any blood thinners.  Only prior abdominal surgery includes an oophorectomy.  Past Medical History Past Medical History:  Diagnosis Date   Breast cancer (HCC)    Cancer (HCC)    skin ca   Diabetes mellitus without complication (HCC)    Glaucoma    Hypertension    Renal disorder    Von Willebrand disease, type IIa (HCC)       Past Surgical History:  Procedure Laterality Date   BREAST BIOPSY Right 01/12/2016   path pending/stereo affirm   OOPHORECTOMY Left     Allergies  Allergen Reactions   Aspirin Other (See Comments)    Von Willebrand's disease Contraindication due to bleeding disorder Von Willebrand's disease    Desmopressin Other (See Comments)    with injectable ddavp Bad headache with injectable ddavp with injectable ddavp    Ibuprofen Other (See Comments)    Von Willebrand's disease NSAIDS contraindicated with bleeding disorder.  Von Willebrand's disease    Other Other (See Comments)     Von Willebrand's disease  NSAIDS contraindicated with bleeding disorder.  Other reaction(s): Other (See Comments)  Von Willebrand's disease      Current Facility-Administered Medications  Medication Dose Route Frequency Provider Last Rate Last Admin   piperacillin-tazobactam (ZOSYN) IVPB 3.375 g   3.375 g Intravenous Once Funke, Mary E, MD       Current Outpatient Medications  Medication Sig Dispense Refill   Acetaminophen 650 MG/20.3ML SUSP Take 1 tablet by mouth daily as needed.     ACETAMINOPHEN 8 HOUR PO Take 1 tablet by mouth daily.     antihemophilic factor-vwf (HUMATE-P) [id number] units SOLR injection Infuse Humate P 4130 RCOunits (+/-10%) IVP 3x weekly on M-W-F and Q12-24 hours prn bleeding as directed     bimatoprost (LUMIGAN) 0.01 % SOLN Apply to eye.     brimonidine (ALPHAGAN) 0.2 % ophthalmic solution Apply to eye.     dorzolamide-timolol (COSOPT) 22.3-6.8 MG/ML ophthalmic solution Apply to eye.     furosemide (LASIX) 20 MG tablet Take 20 mg by mouth daily.     glimepiride (AMARYL) 4 MG tablet Take by mouth.     glucose blood (ACCU-CHEK AVIVA PLUS) test strip Use 2 (two) times daily. Use as instructed.     glucose blood (PRECISION QID TEST) test strip Use 2 (two) times daily. Use as instructed.     Insulin Pen Needle (NOVOTWIST PEN NEEDLE) 32G X 5 MM MISC USE AS DRECTED     levothyroxine (SYNTHROID) 125 MCG tablet Take by mouth.     liraglutide (VICTOZA) 18 MG/3ML SOPN DIAL AND INJECT UNDER THE SKIN 1.8 MG DAILY *APPT NEEDED*     meclizine (ANTIVERT) 25 MG tablet Take 1 tablet by mouth every 6 (six) hours as needed.  ondansetron (ZOFRAN) 4 MG tablet Take by mouth.     pioglitazone-metformin (ACTOPLUS MET) 15-500 MG tablet Take by mouth.     predniSONE  (DELTASONE ) 50 MG tablet Take one 50 mg tablet once daily for the next five days. 5 tablet 0   simvastatin (ZOCOR) 40 MG tablet Take by mouth.     sucralfate (CARAFATE) 1 g tablet TAKE ONE TABLET BY MOUTH FOUR TIMES A DAY BEFORE MEALS AND NIGHTLY     tamoxifen (NOLVADEX) 20 MG tablet Take 1 tablet by mouth daily.     tranexamic acid (LYSTEDA) 650 MG TABS tablet Take 1,300 mg by mouth 3 (three) times daily.      Family History Family History  Problem Relation Age of Onset   Breast cancer Maternal Aunt 55       2 mat  aunts   Breast cancer Cousin    Breast cancer Other       Social History Social History   Tobacco Use   Smoking status: Never   Smokeless tobacco: Never  Substance Use Topics   Alcohol use: Not Currently   Drug use: Not Currently        Review of Systems  All other systems reviewed and are negative.    Physical Exam Blood pressure (!) 144/52, pulse 73, temperature 99 F (37.2 C), temperature source Oral, resp. rate 16, height 5' 6 (1.676 m), weight 109.8 kg, SpO2 97%. Last Weight  Most recent update: 07/05/2023 12:28 PM    Weight  109.8 kg (242 lb)             CONSTITUTIONAL: Well developed, and nourished, obese female, appropriately responsive and aware without distress.   EYES: Sclera non-icteric.   EARS, NOSE, MOUTH AND THROAT:  The oropharynx is clear. Oral mucosa is pink and moist.    Hearing is intact to voice.  NECK: Trachea is midline, and there is no jugular venous distension.  LYMPH NODES:  Lymph nodes in the neck are not appreciated. RESPIRATORY:  Lungs are clear, and breath sounds are equal bilaterally. Normal respiratory effort without pathologic use of accessory muscles. CARDIOVASCULAR: Heart is regular in rate and rhythm.  Well perfused.  GI: The abdomen is tender to the lower abdomen not well localized, without guarding or rebound tenderness.  Otherwise soft, nontender, and nondistended. There were no palpable masses.  I did not appreciate hepatosplenomegaly.  MUSCULOSKELETAL: Right knee scar noted, lymphedema present. SKIN: Skin turgor is normal. No pathologic skin lesions appreciated.  NEUROLOGIC:  Motor and sensation appear grossly normal.  Cranial nerves are grossly without defect. PSYCH:  Alert and oriented to person, place and time. Affect is appropriate for situation.  Data Reviewed I have personally reviewed what is currently available of the patient's imaging, recent labs and medical records.   Labs:     Latest Ref Rng & Units 07/05/2023    12:29 PM 10/16/2013    8:26 PM 04/04/2012    9:40 AM  CBC  WBC 4.0 - 10.5 K/uL 13.7  10.2  9.7   Hemoglobin 12.0 - 15.0 g/dL 32.4  40.1  02.7   Hematocrit 36.0 - 46.0 % 43.8  42.1  44.4   Platelets 150 - 400 K/uL 83  167  176       Latest Ref Rng & Units 07/05/2023   12:29 PM 05/02/2017   10:04 AM 10/16/2013    8:26 PM  CMP  Glucose 70 - 99 mg/dL 253   664   BUN 8 -  23 mg/dL 12   22   Creatinine 2.13 - 1.00 mg/dL 0.86  5.78  4.69   Sodium 135 - 145 mmol/L 138   134   Potassium 3.5 - 5.1 mmol/L 4.1   4.1   Chloride 98 - 111 mmol/L 102   104   CO2 22 - 32 mmol/L 26   22   Calcium 8.9 - 10.3 mg/dL 9.0   9.1   Total Protein 6.5 - 8.1 g/dL 7.6     Total Bilirubin 0.0 - 1.2 mg/dL 1.0     Alkaline Phos 38 - 126 U/L 59     AST 15 - 41 U/L 15     ALT 0 - 44 U/L 11        Imaging: Radiological images reviewed:   Within last 24 hrs: CT ABDOMEN PELVIS W CONTRAST Result Date: 07/05/2023 CLINICAL DATA:  Acute lower abdominal pain. EXAM: CT ABDOMEN AND PELVIS WITH CONTRAST TECHNIQUE: Multidetector CT imaging of the abdomen and pelvis was performed using the standard protocol following bolus administration of intravenous contrast. RADIATION DOSE REDUCTION: This exam was performed according to the departmental dose-optimization program which includes automated exposure control, adjustment of the mA and/or kV according to patient size and/or use of iterative reconstruction technique. CONTRAST:  100mL OMNIPAQUE IOHEXOL 300 MG/ML  SOLN COMPARISON:  March 21, 2005. FINDINGS: Lower chest: No acute abnormality. Hepatobiliary: Cholelithiasis. No biliary dilatation. Nodular hepatic contours are noted suggesting hepatic cirrhosis. Pancreas: Unremarkable. No pancreatic ductal dilatation or surrounding inflammatory changes. Spleen: Normal in size without focal abnormality. Adrenals/Urinary Tract: Adrenal glands are unremarkable. Kidneys are normal, without renal calculi, focal lesion, or hydronephrosis. Bladder  is unremarkable. Stomach/Bowel: The stomach is unremarkable. There is no evidence of bowel obstruction. Severe thickening and inflammatory changes are seen involving the appendix with appendicolith present. This is consistent with appendicitis without definite abscess formation. Appendix has maximum measured diameter of 19 mm. Vascular/Lymphatic: Aortic atherosclerosis. No enlarged abdominal or pelvic lymph nodes. Reproductive: Uterus is unremarkable. No adnexal abnormality is noted. Other: No ascites or hernia is noted. Musculoskeletal: No acute or significant osseous findings. IMPRESSION: Findings consistent with acute appendicitis. Appendicolith is noted. No definite abscess is noted. Hepatic cirrhosis. Cholelithiasis. Aortic Atherosclerosis (ICD10-I70.0). Electronically Signed   By: Rosalene Colon M.D.   On: 07/05/2023 14:35    Assessment    Acute appendicitis. Patient Active Problem List   Diagnosis Date Noted   Pain and swelling of lower leg 06/22/2020   Lymphedema 06/22/2020   Essential hypertension 06/22/2020   Hyperlipidemia 06/22/2020   Diabetes (HCC) 06/22/2020    Plan    The risks, benefits, complications, treatment options, and expected outcomes were discussed with the patient. The treatment of antibiotics alone was discussed giving a 20% chance that this could fail and surgery would be necessary.  Also discussed continuing to the operating room for RA-Laparoscopic Appendectomy.  The possibilities of  bleeding, recurrent infection, perforation of viscus, finding a normal appendix, the need for additional procedures, failure to diagnose a condition, conversion to open procedure and creating a complication requiring transfusion or further operations were discussed. The patient was given the opportunity to ask questions and have them answered.  Patient would like to proceed with Laparoscopic Appendectomy and consent was obtained.     Latest Ref Rng & Units 07/05/2023   12:29 PM  10/16/2013    8:26 PM 04/04/2012    9:40 AM  CBC  WBC 4.0 - 10.5 K/uL 13.7  10.2  9.7   Hemoglobin 12.0 - 15.0 g/dL 13.0  86.5  78.4   Hematocrit 36.0 - 46.0 % 43.8  42.1  44.4   Platelets 150 - 400 K/uL 83  167  176      APPENDECTOMY, ROBOT-ASSISTED, LAPAROSCOPIC: 44970 (CPT)  Face-to-face time spent with the patient and accompanying care providers(if present) was 40 minutes, spent counseling, educating, and coordinating care of the patient.    These notes generated with voice recognition software. I apologize for typographical errors.  Flynn Hylan M.D., FACS 07/05/2023, 2:57 PM

## 2023-07-05 NOTE — ED Notes (Signed)
 Returned from CT.

## 2023-07-05 NOTE — ED Provider Notes (Signed)
 [facility] Provider Note    Event Date/Time   First MD Initiated Contact with Patient 07/05/23 1320     (approximate)   History   Abdominal Pain   HPI  [name] is a 68 y.o. female who comes in with lower abdominal pain for the past 2 days with pain now radiating to her pelvis with some temperatures of 100.1.  She is already had 1 ovary removed but denies any other abdominal surgeries.  She denies any chest pain, shortness of breath upper abdominal pain.  Patient does report having surgery on her right leg 2 years ago and she is got chronic swelling of it since then.  She never had an ultrasound to evaluate it however.  She does report a history of von Willebrand factor deficiency.  She also has a history of diabetes, hypothyroidism, hyperlipidemia.  No blood thinners.   Physical Exam   Triage Vital Signs: ED Triage Vitals  Encounter Vitals Group     BP 07/05/23 1228 (!) 151/62     Systolic BP Percentile --      Diastolic BP Percentile --      Pulse Rate 07/05/23 1228 85     Resp 07/05/23 1228 15     Temp 07/05/23 1228 99 F (37.2 C)     Temp Source 07/05/23 1228 Oral     SpO2 07/05/23 1228 95 %     Weight 07/05/23 1228 242 lb (109.8 kg)     Height 07/05/23 1228 5' 6 (1.676 m)     Head Circumference --      Peak Flow --      Pain Score 07/05/23 1225 5     Pain Loc --      Pain Education --      Exclude from Growth Chart --     Most recent vital signs: Vitals:   07/05/23 1228 07/05/23 1342  BP: (!) 151/62 (!) 144/52  Pulse: 85 73  Resp: 15 16  Temp: 99 F (37.2 C)   SpO2: 95% 97%     General: Awake, no distress.  CV:  Good peripheral perfusion.  Resp:  Normal effort.  Abd:  No distention.  Tender in the lower abdomen without rebound, guarding Other:  Right leg is swollen compared to the left but she reports that this has been a ongoing issue for 2 years.  Denies any calf tenderness   ED Results / Procedures / Treatments    Labs (all labs ordered are listed, but only abnormal results are displayed) Labs Reviewed  COMPREHENSIVE METABOLIC PANEL WITH GFR - Abnormal; Notable for the following components:      Result Value   Glucose, Bld 159 (*)    Albumin 3.4 (*)    All other components within normal limits  CBC - Abnormal; Notable for the following components:   WBC 13.7 (*)    Platelets 83 (*)    All other components within normal limits  URINALYSIS, ROUTINE W REFLEX MICROSCOPIC - Abnormal; Notable for the following components:   Color, Urine YELLOW (*)    APPearance HAZY (*)    Hgb urine dipstick MODERATE (*)    Protein, ur 30 (*)    Leukocytes,Ua TRACE (*)    Bacteria, UA RARE (*)    All other components within normal limits  LIPASE, BLOOD    RADIOLOGY I have reviewed the ct personally and interpreted no obvious kidney stone    PROCEDURES:  Critical Care performed:  No  Procedures   MEDICATIONS ORDERED IN ED: Medications  piperacillin-tazobactam (ZOSYN) IVPB 3.375 g ( Intravenous MAR Hold 07/05/23 1526)  fentaNYL (SUBLIMAZE) injection 50 mcg (50 mcg Intravenous Given 07/05/23 1341)  ondansetron (ZOFRAN) injection 4 mg (4 mg Intravenous Given 07/05/23 1340)  iohexol (OMNIPAQUE) 300 MG/ML solution 100 mL (100 mLs Intravenous Contrast Given 07/05/23 1400)     IMPRESSION / MDM / ASSESSMENT AND PLAN / ED COURSE  I reviewed the triage vital signs and the nursing notes.   Patient's presentation is most consistent with acute presentation with potential threat to life or bodily function.   Patient comes in with lower abdominal pain.  CT imaging ordered evaluate for appendicitis, perforation, diverticulitis.  She does have some chronic right leg swelling but is never had an ultrasound to evaluate it.  She denies any shortness of breath to suggest PE but I did order an ultrasound to rule out DVT.  She is got good distal pulse patient was treated with some IV fentanyl for pain  CBC shows elevated  white count although patient does not meet sepsis criteria.  Her vital signs otherwise are reassuring.  Her CMP and lipase are reassuring.  CT scan does confirm appendicitis.  Discussed with Dr. Ofilia Benton.  He is aware that ultrasound of the leg is still pending.  Patient ordered a dose of Zosyn and patient taken to the OR for appendectomy  I did discuss the incidental findings on CT imaging with patient including possible cirrhosis, gallstones.  I added on INR and she understands to follow this up with her primary care doctor     FINAL CLINICAL IMPRESSION(S) / ED DIAGNOSES   Final diagnoses:  Acute appendicitis, unspecified acute appendicitis type     Rx / DC Orders   ED Discharge Orders     None        Note:  This document was prepared using Dragon voice recognition software and may include unintentional dictation errors.   Lubertha Rush, MD 07/05/23 [phone]

## 2023-07-05 NOTE — Op Note (Signed)
 Robotic appendectomy  Pre-operative Diagnosis: Acute appendicitis  Post-operative Diagnosis: same, with contained perforation.   Surgeon: Flynn Hylan, M.D., [facility]  Anesthesia: General Endotracheal  Findings: Well walled off, perforated appendix, with fecal soilage after mobilization.  Perforation near base of appendix, sufficient stump closure with stapler.  Estimated Blood Loss: 25 mL         Specimens:  Appendix          Complications: none              Procedure Details  The patient was seen again in the Holding Room. The benefits, complications, treatment options, and expected outcomes were discussed with the patient. The risks of bleeding, infection, recurrence of symptoms, failure to resolve symptoms, unanticipated injury, prosthetic placement, prosthetic infection, any of which could require further surgery were reviewed with the patient. The likelihood of improving the patient's symptoms with return to their baseline status is expected.  The patient and/or family concurred with the proposed plan, giving informed consent.  The patient was taken to Operating Room, identified and the procedure verified.    Prior to the induction of general anesthesia, antibiotic prophylaxis was administered. VTE prophylaxis was in place. GETA was then administered and tolerated well. After the induction, the patient was positioned in the supine position and the abdomen was prepped with  Chloraprep and draped in the sterile fashion.  A Time Out was held and the above information confirmed.  After local infiltration of quarter percent Marcaine with epinephrine, stab incision was made left upper quadrant.  Just below the costal margin approximately midclavicular line the Veress needle is passed with sensation of the layers to penetrate the abdominal wall and into the peritoneum.  Saline drop test is confirmed peritoneal placement.  Insufflation is initiated with carbon dioxide to pressures of 15  mmHg.  With local anesthetic infiltration, a left lower quadrant incision is made, and an optical 12 mm trocar is passed into the peritoneal cavity under direct visualization.  Additional three 8.5 mm robotic trochars placed in the left abdominal wall under direct visualization.  Local infiltration with 0.25% Marcaine with epinephrine is utilized for all port sites with deep infiltration under visualization.   Using a Tips up, & force bipolar graspers with monopolar scissors I proceeded with dissecting out the soft tissues adjacent to the cecum and appendix to fully identify the appendix, and mobilize it to the cecal junction. During that process I uncovered the perforated area and some feculent soilage occurred.  Was limited to the region and quickly aspirated with robotic irrigator aspirator. The mesoappendix was carefully divided utilizing bipolar cautery, monopolar cautery and scissors. With the appendiceal cecal junction fully isolated, we then undocked the robot and proceeded with completing the procedure laparoscopically. I used a 45 mm white load laparoscopic stapler to complete the division of the appendix at the cecum.  I inspected the staple line and it was clearly proximal to the perforated site, and incorporated the appendix at its junction with the cecum.  There is no evidence of bleeding, and it appears that we appear to have a good closure.  However due to the nature of proximal appendix I felt it was prudent to leave a drain in the vicinity considering the walled off nature of the adjacent soft tissues.  A 19 Blake drain is placed within the site of the appendiceal stump, within the adjacent edematous soft tissues that had walled it off.  Drain was secured to the skin with 3-0 nylon. We  then placed the appendix in a retrieval bag and withdrew it out the largest port site. We closed the largest port site utilizing PMI and cone with a 0 Vicryl under direct visualization.  I utilized a total  of 3-1/2 L of normal saline solution irrigant to sequentially dilute, with serial aliquots decontamination peritoneal cavity. The abdomen was then desufflated and the trochars removed. Incisions were then irrigated and closed with subcuticulars of 4-0 Monocryl.  Skin sealed with Dermabond.  Patient tolerated procedure well.  Hemostasis was adequate.     Flynn Hylan M.D., [facility] 07/05/2023 6:09 PM

## 2023-07-06 ENCOUNTER — Encounter: Payer: Self-pay | Admitting: Surgery

## 2023-07-06 ENCOUNTER — Inpatient Hospital Stay

## 2023-07-06 DIAGNOSIS — K3532 Acute appendicitis with perforation and localized peritonitis, without abscess: Secondary | ICD-10-CM | POA: Diagnosis not present

## 2023-07-06 LAB — BASIC METABOLIC PANEL WITH GFR
Anion gap: 8 (ref 5–15)
BUN: 13 mg/dL (ref 8–23)
CO2: 26 mmol/L (ref 22–32)
Calcium: 7.9 mg/dL — ABNORMAL LOW (ref 8.9–10.3)
Chloride: 104 mmol/L (ref 98–111)
Creatinine, Ser: 0.87 mg/dL (ref 0.44–1.00)
GFR, Estimated: >60 mL/min (ref >60)
Glucose, Bld: 176 mg/dL — ABNORMAL HIGH (ref 70–99)
Potassium: 3.8 mmol/L (ref 3.5–5.1)
Sodium: 138 mmol/L (ref 135–145)

## 2023-07-06 LAB — CBC
HCT: 39.7 % (ref 36.0–46.0)
Hemoglobin: 13 g/dL (ref 12.0–15.0)
MCH: 31.4 pg (ref 26.0–34.0)
MCHC: 32.7 g/dL (ref 30.0–36.0)
MCV: 95.9 fL (ref 80.0–100.0)
Platelets: 62 10*3/uL — ABNORMAL LOW (ref 150–400)
RBC: 4.14 MIL/uL (ref 3.87–5.11)
RDW: 14 % (ref 11.5–15.5)
WBC: 11.4 10*3/uL — ABNORMAL HIGH (ref 4.0–10.5)
nRBC: 0 % (ref 0.0–0.2)

## 2023-07-06 LAB — GLUCOSE, CAPILLARY
Glucose-Capillary: 174 mg/dL — ABNORMAL HIGH (ref 70–99)
Glucose-Capillary: 164 mg/dL — ABNORMAL HIGH (ref 70–99)
Glucose-Capillary: 118 mg/dL — ABNORMAL HIGH (ref 70–99)
Glucose-Capillary: 130 mg/dL — ABNORMAL HIGH (ref 70–99)

## 2023-07-06 NOTE — Anesthesia Postprocedure Evaluation (Signed)
 Anesthesia Post Note  Patient: [name]  Procedure(s) Performed: APPENDECTOMY, ROBOT-ASSISTED, LAPAROSCOPIC (Abdomen)  Patient location during evaluation: PACU Anesthesia Type: General Level of consciousness: awake and alert Pain management: pain level controlled Vital Signs Assessment: post-procedure vital signs reviewed and stable Respiratory status: spontaneous breathing, nonlabored ventilation, respiratory function stable and patient connected to nasal cannula oxygen Cardiovascular status: blood pressure returned to baseline and stable Postop Assessment: no apparent nausea or vomiting Anesthetic complications: no   No notable events documented.   Last Vitals:  Vitals:   07/05/23 2330 07/06/23 0448  BP: (!) 150/60 (!) 137/45  Pulse: 81 82  Resp: 17 18  Temp: 37.1 C 37.1 C  SpO2: 98% 97%    Last Pain:  Vitals:   07/06/23 0448  TempSrc: Oral  PainSc:                  Zula Hitch

## 2023-07-06 NOTE — Progress Notes (Signed)
 PT Cancellation Note  Patient Details Name: ANITA LAGUNA MRN: [id number] DOB: [birth year]   Cancelled Treatment:     PT evaluation and Tx not completed 2/2 pt refused to participate due to feeling nauseous, dizzy and very sleepy. Pt requested PT to return tomorrow. Nurse notified about pt's symptoms. PT plans to attempt again tomorrow.    Lynnsie Linders H Camille Thau 07/06/2023, 1:53 PM

## 2023-07-06 NOTE — Evaluation (Signed)
 Occupational Therapy Evaluation Patient Details Name: [name] MRN: [id number] DOB: March 20, 1955 Today's Date: 07/06/2023   History of Present Illness   Pt is a 68 y.o. female who presents with lower abdominal pain with admitting diagnosis of acute appendicitis s/p appendectomy 6/11. PMH: type 2 diabetes mellitus, Von Willebrand disease type Iia, essential hypertension, anorexia with nausea, gallstones, oophorectomy.     Clinical Impressions Pt was seen for OT evaluation this date. PTA, pt lives in a 2 level home with 2 STE and no HR. Pt reports she has a full bath on the main level, but no bedroom. She reports she can set up a place to stay downstairs.    Pt presents to acute OT demonstrating impaired ADL performance and functional mobility 2/2 weakness, low activity tolerance, pain and balance deficits. Pt currently reports increased pain to incision site and dizziness. She also reports nausea. Pt noted to urinate and did not realize her purewick had not been replaced. Min/CGA for bed mobility via log roll technique with cueing for hand rail use and sequencing. CGA for seated balance at EOB with mild lateral lean. Able to stand from EOB to RW with Mod A with bed height slightly elevated to maximize ease. Max A for LB dressing to doff underwear and don new ones. Pt took a few lateral steps to [facility] using RW with Min A for safety prior to returning to supine. Purewick changed and pt left with all needs in place. Pt would benefit from skilled OT services to address noted impairments and functional limitations to maximize safety and independence while minimizing falls risk and caregiver burden. Do anticipate the need for follow up OT services upon acute hospital DC.      If plan is discharge home, recommend the following:   A little help with walking and/or transfers;A lot of help with bathing/dressing/bathroom;Assistance with cooking/housework;Assist for transportation;Help with stairs or  ramp for entrance     Functional Status Assessment   Patient has had a recent decline in their functional status and demonstrates the ability to make significant improvements in function in a reasonable and predictable amount of time.     Equipment Recommendations   Other (comment) (RW)     Recommendations for Other Services         Precautions/Restrictions   Precautions Precautions: Fall Recall of Precautions/Restrictions: Intact Precaution/Restrictions Comments: abdominal precautions for comfort Restrictions Weight Bearing Restrictions Per Provider Order: No     Mobility Bed Mobility Overal bed mobility: Needs Assistance Bed Mobility: Rolling, Sidelying to Sit, Sit to Sidelying Rolling: Contact guard assist, Used rails Sidelying to sit: Min assist, Contact guard assist, HOB elevated, Used rails     Sit to sidelying: Mod assist General bed mobility comments: Min to CGA with cueing for log roll technique and most assist for BLEs management; increased time/effort    Transfers Overall transfer level: Needs assistance Equipment used: Rolling walker (2 wheels) Transfers: Sit to/from Stand Sit to Stand: Mod assist, From elevated surface           General transfer comment: Mod A from elevated bed height with forward flexed posture on RW and mildly unsteady d/t dizziness/nausea      Balance Overall balance assessment: Needs assistance Sitting-balance support: Feet supported, Bilateral upper extremity supported Sitting balance-Leahy Scale: Fair Sitting balance - Comments: mild lateral lean noted with sitting EOB (likely d/t dizziness) unable to check BPs d/t needing to change underwear/purewick Postural control: Right lateral lean Standing balance support: Reliant on  assistive device for balance, Bilateral upper extremity supported Standing balance-Leahy Scale: Fair Standing balance comment: heavy UE dependence on RW while standing at EOB, able to take lateral  steps to [facility] using RW with Min/CGA                           ADL either performed or assessed with clinical judgement   ADL Overall ADL's : Needs assistance/impaired                     Lower Body Dressing: Maximal assistance;Sitting/lateral leans;Sit to/from stand Lower Body Dressing Details (indicate cue type and reason): to doff underwear standing and don new ones at bed level                     Vision         Perception         Praxis         Pertinent Vitals/Pain Pain Assessment Pain Assessment: Faces Faces Pain Scale: Hurts even more Pain Location: L abdomen/incision site Pain Descriptors / Indicators: Tender, Sore, Grimacing, Guarding Pain Intervention(s): Monitored during session, Repositioned, Limited activity within patient's tolerance     Extremity/Trunk Assessment Upper Extremity Assessment Upper Extremity Assessment: Overall WFL for tasks assessed   Lower Extremity Assessment Lower Extremity Assessment: Generalized weakness       Communication Communication Communication: No apparent difficulties   Cognition Arousal: Alert Behavior During Therapy: WFL for tasks assessed/performed Cognition: No apparent impairments                               Following commands: Intact       Cueing  General Comments      dizziness, nausea and pain noted during session; purewick changed and all lines/leads and JP drain intact pre/post session   Exercises Other Exercises Other Exercises: Edu on role of OT in acute setting.   Shoulder Instructions      Home Living Family/patient expects to be discharged to:: Private residence Living Arrangements: Spouse/significant other;Children Available Help at Discharge: Family;Available 24 hours/day Type of Home: House Home Access: Stairs to enter [employer] of Steps: 2 (1 + 1) Entrance Stairs-Rails: None Home Layout: Two level;Bed/bath upstairs;Full bath on  main level Alternate Level Stairs-Number of Steps: flight, 2 steps up then landing then 12 or so more Alternate Level Stairs-Rails: Left;Right;Can reach both Bathroom Shower/Tub: Chief Strategy Officer: Handicapped height     Home Equipment: Shower seat;Cane - quad;Cane - single point;BSC/3in1          Prior Functioning/Environment Prior Level of Function : Independent/Modified Independent             Mobility Comments: no device use indoors, cane use outdoors; denies falls; does not drive ADLs Comments: IND with ADLs, husband places shower chair into tub for her; gardens outside, etc    OT Problem List: Decreased strength;Decreased activity tolerance;Impaired balance (sitting and/or standing);Pain   OT Treatment/Interventions: Self-care/ADL training;Therapeutic exercise;Therapeutic activities;Energy conservation;Patient/family education;Balance training      OT Goals(Current goals can be found in the care plan section)   Acute Rehab OT Goals Patient Stated Goal: improve pain and dizziness OT Goal Formulation: With patient Time For Goal Achievement: 07/20/23 Potential to Achieve Goals: Good ADL Goals Pt Will Perform Lower Body Bathing: with min assist;with contact guard assist;sitting/lateral leans;with adaptive equipment;sit to/from stand Pt  Will Perform Lower Body Dressing: with min assist;with contact guard assist;sitting/lateral leans;sit to/from stand;with adaptive equipment Pt Will Transfer to Toilet: with contact guard assist;ambulating;with min assist;regular height toilet;bedside commode;grab bars Pt Will Perform Toileting - Clothing Manipulation and hygiene: with min assist;sit to/from stand;sitting/lateral leans;with contact guard assist   OT Frequency:  Min 2X/week    Co-evaluation              AM-PAC OT 6 Clicks Daily Activity     Outcome Measure Help from another person eating meals?: None Help from another person taking care of  personal grooming?: A Little Help from another person toileting, which includes using toliet, bedpan, or urinal?: A Lot Help from another person bathing (including washing, rinsing, drying)?: A Lot Help from another person to put on and taking off regular upper body clothing?: A Little Help from another person to put on and taking off regular lower body clothing?: A Lot 6 Click Score: 16   End of Session Equipment Utilized During Treatment: Rolling walker (2 wheels) Nurse Communication: Mobility status  Activity Tolerance: Patient tolerated treatment well;No increased pain;Treatment limited secondary to medical complications (Comment) (dizziness/nausea) Patient left: in bed;with call bell/phone within reach;with bed alarm set  OT Visit Diagnosis: Other abnormalities of gait and mobility (R26.89);Unsteadiness on feet (R26.81);Muscle weakness (generalized) (M62.81)                Time: 1610-9604 OT Time Calculation (min): 33 min Charges:  OT General Charges $OT Visit: 1 Visit OT Evaluation $OT Eval Moderate Complexity: 1 Mod OT Treatments $Self Care/Home Management : 8-22 mins Keyvon Herter, OTR/L  07/06/23, 1:57 PM  Giang Hemme E Abbi Mancini 07/06/2023, 1:41 PM

## 2023-07-06 NOTE — Progress Notes (Signed)
 [facility] SURGICAL PROGRESS NOTE  Hospital Day(s): 1.   Post op day(s): 1 Day Post-Op.   Interval History:  Patient seen and examined No acute events or new complaints overnight.  Patient reports she remains quite sore in RLQ and at incisions No fever, chills, nausea, emesis  Leukocytosis is improving; WBC 11.4K Hgb to 13.0 Renal function normal; sCr - 0.87; UO - unmeasured No electrolyte derangements  Surgical drain with 85 ccs out; serous with scant clot CLD; no issue No flatus   Vital signs in last 24 hours: [min-max] current  Temp:  [96.8 F (36 C)-99.9 F (37.7 C)] 98.9 F (37.2 C) (06/12 0808) Pulse Rate:  [73-85] 81 (06/12 0808) Resp:  [15-22] 17 (06/12 0808) BP: (113-151)/(45-82) 131/52 (06/12 0808) SpO2:  [93 %-100 %] 97 % (06/12 0808) Weight:  [109.8 kg] 109.8 kg (06/11 1228)     Height: 5' 6 (167.6 cm) Weight: 109.8 kg BMI (Calculated): 39.08   Intake/Output last 2 shifts:  06/11 0701 - 06/12 0700 In: 1290 [P.O.:240; I.V.:800; IV Piggyback:250] Out: 110 [Drains:85; Blood:25]   Physical Exam:  Constitutional: alert, cooperative and no distress  Respiratory: breathing non-labored at rest  Cardiovascular: regular rate and sinus rhythm  Gastrointestinal: soft, incisional soreness, she remains tender in RLQ, no rebound/guarding. Surgical drain in LLQ; serous Integumentary:  Laparoscopic incisions are CDI with dermabond, no erythema   Labs:     Latest Ref Rng & Units 07/06/2023    5:45 AM 07/05/2023   12:29 PM 10/16/2013    8:26 PM  CBC  WBC 4.0 - 10.5 K/uL 11.4  13.7  10.2   Hemoglobin 12.0 - 15.0 g/dL 95.6  21.3  08.6   Hematocrit 36.0 - 46.0 % 39.7  43.8  42.1   Platelets 150 - 400 K/uL 62  83  167       Latest Ref Rng & Units 07/06/2023    5:45 AM 07/05/2023   12:29 PM 05/02/2017   10:04 AM  CMP  Glucose 70 - 99 mg/dL 578  469    BUN 8 - 23 mg/dL 13  12    Creatinine 6.29 - 1.00 mg/dL 5.28  4.13  2.44   Sodium 135 - 145 mmol/L 138   138    Potassium 3.5 - 5.1 mmol/L 3.8  4.1    Chloride 98 - 111 mmol/L 104  102    CO2 22 - 32 mmol/L 26  26    Calcium 8.9 - 10.3 mg/dL 7.9  9.0    Total Protein 6.5 - 8.1 g/dL  7.6    Total Bilirubin 0.0 - 1.2 mg/dL  1.0    Alkaline Phos 38 - 126 U/L  59    AST 15 - 41 U/L  15    ALT 0 - 44 U/L  11       Imaging studies: No new pertinent imaging studies   Assessment/Plan:  68 y.o. female 1 Day Post-Op s/p robotic assisted laparoscopic appendectomy for acute perforated appendicitis   - She wishes to remain on CLD which is reasonable; Okay to advance as tolerated when ready   - Continue IV Abx (Zosyn)  - Continue surgical drain; monitor and record output  - Monitor abdominal examination; on-going bowel function   - Pain control prn; antiemetics prn  - Needs to mobilize; low threshold to engage therapies  - Appreciate medicine assistance    - Discharge Planning: Given perforated appendicitis and intra-operative findings, I do think she will  benefit from another 24-48 hours in house for IV Abx, she is appreciative of this.    All of the above findings and recommendations were discussed with the patient, and the medical team, and all of patient's questions were answered to her expressed satisfaction.  -- Apolonio Bay, PA-C [facility] 07/06/2023, 8:33 AM M-F: 7am - 4pm

## 2023-07-06 NOTE — Progress Notes (Signed)
 Patient nauseated at this time but is refusing to take any meds. Just says she wants to rest and lay still.

## 2023-07-06 NOTE — Hospital Course (Addendum)
 Hospital course / significant events:   HPI: [name] is a 68 y.o. female with medical history significant for type 2 diabetes mellitus, Von Willebrand disease type IIa and essential hypertension, who presented to the emergency room with acute onset of lower abdominal pain started on Monday morning with associated fever and chills.  She has been having anorexia with nausea without vomiting.  She has not had any bowel movements since her pain started.   06/11: CT w/ acute appendicitis with appendicolith noted. There was no definite abscess. Also hepatic cirrhosis, cholelithiasis and aortic atherosclerosis. WBC 13.7, Plt 83. Started on Zosyn . Underwent appendectomy. Hospitalist consulted for medical management  06/12: WBC improving, no concerns on BMP. Plt lower today at 62 will monitor closely 06/13: Plt stable, pain improved, mobilizing w/ therapy continuing Zosyn  06/14: Plt lower today but no bleeding. Will need close outpatient follow up for CBC check     ASSESSMENT & PLAN:   Appendicitis - s/p appendectomy  Per general surgery team   Type 2 diabetes mellitus without complications. Resume home medications  Hypothyroidism. continue Synthroid .   Dyslipidemia. continue statin    Thrombocytopenia likely chronic assoc w von willebrand +/- cirrhosis, and likely worsened w/ acute inflammation  Monitor CBC - will need close outpatient follow up  Transfuse if bleeding   Von Willebrand's disease continue tranexamic acid .   GERD without esophagitis. continue Carafate .   History of breast cancer. conitnue Tamoxifen      Class 2 obesity based on BMI: Body mass index is 39.06 kg/m.[name] Significantly low or high BMI is associated with higher medical risk.  Underweight - under 18  overweight - 25 to 29 obese - 30 or more Class 1 obesity: BMI of 30.0 to 34 Class 2 obesity: BMI of 35.0 to 39 Class 3 obesity: BMI of 40.0 to 49 Super Morbid Obesity: BMI 50-59 Super-super Morbid  Obesity: BMI 60+ Healthy nutrition and physical activity advised as adjunct to other disease management and risk reduction treatments

## 2023-07-06 NOTE — Progress Notes (Signed)
 PROGRESS NOTE    [name]   WUJ:[id number] DOB: 09/27/1955  DOA: 07/05/2023 Date of Service: 07/06/23 which is hospital day 1  PCP: Lyle San, MD    Hospital course / significant events:   HPI: [name] is a 68 y.o. female with medical history significant for type 2 diabetes mellitus, Von Willebrand disease type IIa and essential hypertension, who presented to the emergency room with acute onset of lower abdominal pain started on Monday morning with associated fever and chills.  She has been having anorexia with nausea without vomiting.  She has not had any bowel movements since her pain started.   06/11: CT w/ acute appendicitis with appendicolith noted. There was no definite abscess. Also hepatic cirrhosis, cholelithiasis and aortic atherosclerosis. WBC 13.7, Plt 83. Started on Zosyn. Underwent appendectomy. Hospitalist consulted for medical management  06/12: WBC improving, no concerns on BMP. Plt lower today at 62 will monitor closely    ASSESSMENT & PLAN:   Appendicitis Per general surgery team   Type 2 diabetes mellitus without complications. SSI coverage with NovoLog. hold off oral antidiabetics for now.  Hypothyroidism. continue Synthroid.   Dyslipidemia. continue statin    Thrombocytopenia likely chronic assoc w von willebrand +/- cirrhosis, and likely worsened w/ acute inflammation  83 on admission 06/11 --> 62 today 06/12 Monitor CBC Transfuse if bleeding or if <50  Von Willebrand's disease continue tranexamic acid.   GERD without esophagitis. continue Carafate.   History of breast cancer. conitnue Tamoxifen     Class 2 obesity based on BMI: Body mass index is 39.06 kg/m.[name] Significantly low or high BMI is associated with higher medical risk.  Underweight - under 18  overweight - 25 to 29 obese - 30 or more Class 1 obesity: BMI of 30.0 to 34 Class 2 obesity: BMI of 35.0 to 39 Class 3 obesity: BMI of 40.0 to 49 Super Morbid  Obesity: BMI 50-59 Super-super Morbid Obesity: BMI 60+ Healthy nutrition and physical activity advised as adjunct to other disease management and risk reduction treatments    DVT prophylaxis: SCD/ambulation IV fluids: wound not need continuous IV fluids if tolerating PO Nutrition: advance peer surgery   Medical barriers to dispo: none. Expected medical readiness for discharge per surgery team.              Subjective / Brief ROS:  Patient reports feeling a bit better today compared to yesterday but is sore following surgery  Denies CP/SOB.  Pain controlled.  Denies new weakness.  Tolerating diet.    Family Communication: none at this time    Objective Findings:  Vitals:   07/05/23 2330 07/06/23 0448 07/06/23 0808 07/06/23 1536  BP: (!) 150/60 (!) 137/45 (!) 131/52 (!) 103/45  Pulse: 81 82 81 73  Resp: 17 18 17 19   Temp: 98.7 F (37.1 C) 98.7 F (37.1 C) 98.9 F (37.2 C) 98.7 F (37.1 C)  TempSrc: Oral Oral    SpO2: 98% 97% 97% 98%  Weight:      Height:        Intake/Output Summary (Last 24 hours) at 07/06/2023 1648 Last data filed at 07/06/2023 1552 Gross per 24 hour  Intake 1797.23 ml  Output 110 ml  Net 1687.23 ml   Filed Weights   07/05/23 1228  Weight: 109.8 kg    Examination:  Physical Exam Constitutional:      General: She is not in acute distress.  Cardiovascular:     Rate and Rhythm: Normal  rate and regular rhythm.  Pulmonary:     Effort: Pulmonary effort is normal.     Breath sounds: Normal breath sounds.  Abdominal:     General: Bowel sounds are normal. There is no distension.     Palpations: Abdomen is soft.     Tenderness: There is abdominal tenderness in the right lower quadrant.   Neurological:     Mental Status: She is alert.          Scheduled Medications:   brimonidine  1 drop Both Eyes TID   dorzolamide-timolol  1 drop Both Eyes BID   insulin aspart  0-20 Units Subcutaneous TID AC & HS   latanoprost  1 drop  Both Eyes QHS   levothyroxine  125 mcg Oral Q0600   pantoprazole (PROTONIX) IV  40 mg Intravenous QHS   simvastatin  40 mg Oral q1800   tamoxifen  20 mg Oral Daily    Continuous Infusions:  sodium chloride 40 mL/hr at 07/06/23 1552   piperacillin-tazobactam (ZOSYN)  IV 3.375 g (07/06/23 1329)    PRN Medications:  acetaminophen, diphenhydrAMINE **OR** diphenhydrAMINE, hydrALAZINE, HYDROmorphone (DILAUDID) injection, ondansetron **OR** ondansetron (ZOFRAN) IV, oxyCODONE-acetaminophen, sucralfate, tranexamic acid, traZODone  Antimicrobials from admission:  Anti-infectives (From admission, onward)    Start     Dose/Rate Route Frequency Ordered Stop   07/05/23 2100  piperacillin-tazobactam (ZOSYN) IVPB 3.375 g        3.375 g 12.5 mL/hr over 240 Minutes Intravenous Every 8 hours 07/05/23 2013 07/10/23 2159   07/05/23 1500  piperacillin-tazobactam (ZOSYN) IVPB 3.375 g        3.375 g 100 mL/hr over 30 Minutes Intravenous  Once 07/05/23 1445 07/05/23 1637           Data Reviewed:  I have personally reviewed the following...  CBC: Recent Labs  Lab 07/05/23 1229 07/06/23 0545  WBC 13.7* 11.4*  HGB 14.4 13.0  HCT 43.8 39.7  MCV 95.0 95.9  PLT 83* 62*   Basic Metabolic Panel: Recent Labs  Lab 07/05/23 1229 07/06/23 0545  NA 138 138  K 4.1 3.8  CL 102 104  CO2 26 26  GLUCOSE 159* 176*  BUN 12 13  CREATININE 0.77 0.87  CALCIUM 9.0 7.9*   GFR: Estimated Creatinine Clearance: 76.6 mL/min (by C-G formula based on SCr of 0.87 mg/dL). Liver Function Tests: Recent Labs  Lab 07/05/23 1229  AST 15  ALT 11  ALKPHOS 59  BILITOT 1.0  PROT 7.6  ALBUMIN 3.4*   Recent Labs  Lab 07/05/23 1229  LIPASE 27   No results for input(s): AMMONIA in the last 168 hours. Coagulation Profile: Recent Labs  Lab 07/05/23 1544  INR 1.2   Cardiac Enzymes: No results for input(s): CKTOTAL, CKMB, CKMBINDEX, TROPONINI in the last 168 hours. BNP (last 3 results) No  results for input(s): PROBNP in the last 8760 hours. HbA1C: No results for input(s): HGBA1C in the last 72 hours. CBG: Recent Labs  Lab 07/05/23 1826 07/05/23 2320 07/06/23 0933 07/06/23 1149 07/06/23 1537  GLUCAP 184* 181* 174* 164* 118*   Lipid Profile: No results for input(s): CHOL, HDL, LDLCALC, TRIG, CHOLHDL, LDLDIRECT in the last 72 hours. Thyroid Function Tests: No results for input(s): TSH, T4TOTAL, FREET4, T3FREE, THYROIDAB in the last 72 hours. Anemia Panel: No results for input(s): VITAMINB12, FOLATE, FERRITIN, TIBC, IRON, RETICCTPCT in the last 72 hours. Most Recent Urinalysis On File:     Component Value Date/Time   COLORURINE YELLOW (A) 07/05/2023 1229  APPEARANCEUR HAZY (A) 07/05/2023 1229   APPEARANCEUR Clear 06/17/2011 1630   LABSPEC 1.024 07/05/2023 1229   LABSPEC 1.019 06/17/2011 1630   PHURINE 5.0 07/05/2023 1229   GLUCOSEU NEGATIVE 07/05/2023 1229   GLUCOSEU Negative 06/17/2011 1630   HGBUR MODERATE (A) 07/05/2023 1229   BILIRUBINUR NEGATIVE 07/05/2023 1229   BILIRUBINUR Negative 06/17/2011 1630   KETONESUR NEGATIVE 07/05/2023 1229   PROTEINUR 30 (A) 07/05/2023 1229   NITRITE NEGATIVE 07/05/2023 1229   LEUKOCYTESUR TRACE (A) 07/05/2023 1229   LEUKOCYTESUR Negative 06/17/2011 1630   Sepsis Labs: @LABRCNTIP (procalcitonin:4,lacticidven:4) Microbiology: No results found for this or any previous visit (from the past 240 hours).    Radiology Studies last 3 days: US  Venous Img Lower Bilateral (DVT) Result Date: 07/06/2023 CLINICAL DATA:  Leg swelling EXAM: BILATERAL LOWER EXTREMITY VENOUS DOPPLER ULTRASOUND TECHNIQUE: Gray-scale sonography with graded compression, as well as color Doppler and duplex ultrasound were performed to evaluate the lower extremity deep venous systems from the level of the common femoral vein and including the common femoral, femoral, profunda femoral, popliteal and calf veins including the  posterior tibial, peroneal and gastrocnemius veins when visible. The superficial great saphenous vein was also interrogated. Spectral Doppler was utilized to evaluate flow at rest and with distal augmentation maneuvers in the common femoral, femoral and popliteal veins. COMPARISON:  None Available. FINDINGS: RIGHT LOWER EXTREMITY Common Femoral Vein: No evidence of thrombus. Normal compressibility, respiratory phasicity and response to augmentation. Saphenofemoral Junction: No evidence of thrombus. Normal compressibility and flow on color Doppler imaging. Profunda Femoral Vein: No evidence of thrombus. Normal compressibility and flow on color Doppler imaging. Femoral Vein: No evidence of thrombus. Normal compressibility, respiratory phasicity and response to augmentation. Popliteal Vein: No evidence of thrombus. Normal compressibility, respiratory phasicity and response to augmentation. Calf Veins: No evidence of thrombus. Normal compressibility and flow on color Doppler imaging. Superficial Great Saphenous Vein: No evidence of thrombus. Normal compressibility. Venous Reflux:  None. Other Findings:  None. LEFT LOWER EXTREMITY Common Femoral Vein: No evidence of thrombus. Normal compressibility, respiratory phasicity and response to augmentation. Saphenofemoral Junction: No evidence of thrombus. Normal compressibility and flow on color Doppler imaging. Profunda Femoral Vein: No evidence of thrombus. Normal compressibility and flow on color Doppler imaging. Femoral Vein: No evidence of thrombus. Normal compressibility, respiratory phasicity and response to augmentation. Popliteal Vein: No evidence of thrombus. Normal compressibility, respiratory phasicity and response to augmentation. Calf Veins: No evidence of thrombus. Normal compressibility and flow on color Doppler imaging. Superficial Great Saphenous Vein: No evidence of thrombus. Normal compressibility. Venous Reflux:  None. Other Findings:  None. IMPRESSION: No  evidence of deep venous thrombosis in either lower extremity. Electronically Signed   By: Adrianna Horde M.D.   On: 07/06/2023 10:48   CT ABDOMEN PELVIS W CONTRAST Result Date: 07/05/2023 CLINICAL DATA:  Acute lower abdominal pain. EXAM: CT ABDOMEN AND PELVIS WITH CONTRAST TECHNIQUE: Multidetector CT imaging of the abdomen and pelvis was performed using the standard protocol following bolus administration of intravenous contrast. RADIATION DOSE REDUCTION: This exam was performed according to the departmental dose-optimization program which includes automated exposure control, adjustment of the mA and/or kV according to patient size and/or use of iterative reconstruction technique. CONTRAST:  100mL OMNIPAQUE IOHEXOL 300 MG/ML  SOLN COMPARISON:  March 21, 2005. FINDINGS: Lower chest: No acute abnormality. Hepatobiliary: Cholelithiasis. No biliary dilatation. Nodular hepatic contours are noted suggesting hepatic cirrhosis. Pancreas: Unremarkable. No pancreatic ductal dilatation or surrounding inflammatory changes. Spleen: Normal in size without focal abnormality.  Adrenals/Urinary Tract: Adrenal glands are unremarkable. Kidneys are normal, without renal calculi, focal lesion, or hydronephrosis. Bladder is unremarkable. Stomach/Bowel: The stomach is unremarkable. There is no evidence of bowel obstruction. Severe thickening and inflammatory changes are seen involving the appendix with appendicolith present. This is consistent with appendicitis without definite abscess formation. Appendix has maximum measured diameter of 19 mm. Vascular/Lymphatic: Aortic atherosclerosis. No enlarged abdominal or pelvic lymph nodes. Reproductive: Uterus is unremarkable. No adnexal abnormality is noted. Other: No ascites or hernia is noted. Musculoskeletal: No acute or significant osseous findings. IMPRESSION: Findings consistent with acute appendicitis. Appendicolith is noted. No definite abscess is noted. Hepatic cirrhosis.  Cholelithiasis. Aortic Atherosclerosis (ICD10-I70.0). Electronically Signed   By: Rosalene Colon M.D.   On: 07/05/2023 14:35          Melodi Sprung, DO [facility] 07/06/2023, 4:48 PM    Dictation software may have been used to generate the above note. Typos may occur and escape review in typed/dictated notes. Please contact Dr Authur Leghorn directly for clarity if needed.  Staff may message me via secure chat in Epic  but this may not receive an immediate response,  please page me for urgent matters!  If 7PM-7AM, please contact night coverage [email or web address]

## 2023-07-07 DIAGNOSIS — K3532 Acute appendicitis with perforation and localized peritonitis, without abscess: Secondary | ICD-10-CM | POA: Diagnosis not present

## 2023-07-07 LAB — BASIC METABOLIC PANEL WITH GFR
Anion gap: 8 (ref 5–15)
BUN: 18 mg/dL (ref 8–23)
CO2: 27 mmol/L (ref 22–32)
Calcium: 8.3 mg/dL — ABNORMAL LOW (ref 8.9–10.3)
Chloride: 102 mmol/L (ref 98–111)
Creatinine, Ser: 0.84 mg/dL (ref 0.44–1.00)
GFR, Estimated: >60 mL/min (ref >60)
Glucose, Bld: 137 mg/dL — ABNORMAL HIGH (ref 70–99)
Potassium: 3.8 mmol/L (ref 3.5–5.1)
Sodium: 137 mmol/L (ref 135–145)

## 2023-07-07 LAB — GLUCOSE, CAPILLARY
Glucose-Capillary: 126 mg/dL — ABNORMAL HIGH (ref 70–99)
Glucose-Capillary: 129 mg/dL — ABNORMAL HIGH (ref 70–99)
Glucose-Capillary: 141 mg/dL — ABNORMAL HIGH (ref 70–99)
Glucose-Capillary: 110 mg/dL — ABNORMAL HIGH (ref 70–99)

## 2023-07-07 LAB — CBC
HCT: 38.3 % (ref 36.0–46.0)
Hemoglobin: 12.4 g/dL (ref 12.0–15.0)
MCH: 31.6 pg (ref 26.0–34.0)
MCHC: 32.4 g/dL (ref 30.0–36.0)
MCV: 97.7 fL (ref 80.0–100.0)
Platelets: 64 10*3/uL — ABNORMAL LOW (ref 150–400)
RBC: 3.92 MIL/uL (ref 3.87–5.11)
RDW: 13.9 % (ref 11.5–15.5)
WBC: 11.3 10*3/uL — ABNORMAL HIGH (ref 4.0–10.5)
nRBC: 0 % (ref 0.0–0.2)

## 2023-07-07 NOTE — Evaluation (Signed)
 Physical Therapy Evaluation Patient Details Name: [name] MRN: [id number] DOB: 18-Sep-1955 Today's Date: 07/07/2023  History of Present Illness  Pt is a 68 y.o. female who presents to the ED on 07/05/23 with lower abdominal pain with admitting diagnosis of acute appendicitis s/p appendectomy. PMH: type 2 diabetes mellitus, Von Willebrand disease type IIa, essential hypertension, hypothyroidism, breast CA, anorexia with nausea, gallstones, oophorectomy.  Clinical Impression  Prior to recent medical concerns, pt reports being mod independent with ambulation and ADLs; lives in a 2 level home with 2 STE (no handrails) and 14 steps inside the home (bilateral handrails). Currently pt is 1x mod/min A for STS, additional support needed for increased safety and dizziness concerns; pt was able to ambulate 18 ft with RW and 1x CGA/Min A, (increased vc's needed for RW management and sequencing). Pt has limitations in BLE strength, decreased activity tolerance and increased reports of vertigo limiting her ability to ambulate further distances. Pt reports incisional pain 5/10 and R upper abdominal pain 8/10 that remain constant throughout session. Post treatment, PT inspected surgical site, drain was intact, secure and taped down, bleeding was noted above taped area, nurse notified immediately and assisted pt with their needs.Pt would currently benefit from skilled PT to address noted impairments and functional limitations (see below for any additional details).  Upon hospital discharge, pt would benefit from ongoing therapy to return to PLOF.          If plan is discharge home, recommend the following: A little help with walking and/or transfers;A little help with bathing/dressing/bathroom;Assistance with cooking/housework;Assist for transportation;Help with stairs or ramp for entrance   Can travel by private vehicle        Equipment Recommendations Rolling walker (2 wheels)  Recommendations for Other  Services       Functional Status Assessment Patient has had a recent decline in their functional status and demonstrates the ability to make significant improvements in function in a reasonable and predictable amount of time.     Precautions / Restrictions Precautions Precautions: Fall Recall of Precautions/Restrictions: Intact Precaution/Restrictions Comments: abdominal precautions for comfort Restrictions Weight Bearing Restrictions Per Provider Order: No      Mobility  Bed Mobility                    Transfers Overall transfer level: Needs assistance Equipment used: Rolling walker (2 wheels) Transfers: Sit to/from Stand Sit to Stand: Mod assist, Min assist (secondary PT to ensure drain security)           General transfer comment: Vc's for hand placement and for sequencing to stand, pt presents with forward flexed posture on RW, increased dependence for UE support and reports of dizziness.    Ambulation/Gait Ambulation/Gait assistance: Min assist, Contact guard assist (secondary PT for IV management and surgical drain security) Gait Distance (Feet): 18 Feet Assistive device: Rolling walker (2 wheels) Gait Pattern/deviations: Antalgic, Decreased step length - right, Decreased step length - left Gait velocity: decreased     General Gait Details: vc's for RW sequencing  Stairs            Wheelchair Mobility     Tilt Bed    Modified Rankin (Stroke Patients Only)       Balance Overall balance assessment: Needs assistance Sitting-balance support: Feet supported, Bilateral upper extremity supported Sitting balance-Leahy Scale: Fair Sitting balance - Comments: Pt reports of slight dizziness, claiming to be caused by her vertigo.   Standing balance support: Reliant on assistive  device for balance, Bilateral upper extremity supported Standing balance-Leahy Scale: Fair Standing balance comment: heavy UE dependence on RW while standing at EOB. Was  able to maintain a standing posture with RW and 1x CGA/ Min A for approximately 2 minutes while PT performed hygenic care due pt report of being soaked in urine.                             Pertinent Vitals/Pain Pain Assessment Pain Assessment: 0-10 (Incisional pain 5/10, R upper abdominal pain 8/10) Pain Score:  (Incisional pain 5/10, R upper abdominal pain 8/10) Pain Descriptors / Indicators: Tender, Sore, Grimacing, Guarding, Discomfort Pain Intervention(s): Limited activity within patient's tolerance, Monitored during session, Other (comment) (Nurse notified)    Home Living Family/patient expects to be discharged to:: Private residence Living Arrangements: Spouse/significant other;Children Available Help at Discharge: Family;Available 24 hours/day Type of Home: House Home Access: Stairs to enter Entrance Stairs-Rails: None Entrance Stairs-Number of Steps: 2 Alternate Level Stairs-Number of Steps: 14 steps inside to bedroom, pt reported she will be sleeping on recliner and or couch Home Layout: Two level;Bed/bath upstairs;Full bath on main level Home Equipment: Shower seat;Cane - quad;Cane - single point;BSC/3in1 Additional Comments: Had RW but gave away to a neighbor    Prior Function Prior Level of Function : Independent/Modified Independent             Mobility Comments: no device use indoors, cane use outdoors; denies falls; does not drive ADLs Comments: IND with ADLs, husband places shower chair into tub for her; gardens outside, etc     Extremity/Trunk Assessment   Upper Extremity Assessment Upper Extremity Assessment: Defer to OT evaluation    Lower Extremity Assessment Lower Extremity Assessment: Generalized weakness    Cervical / Trunk Assessment Cervical / Trunk Assessment: Normal  Communication   Communication Communication: No apparent difficulties    Cognition Arousal: Alert Behavior During Therapy: WFL for tasks assessed/performed    PT - Cognitive impairments: No apparent impairments                         Following commands: Intact       Cueing Cueing Techniques: Verbal cues, Gestural cues, Tactile cues     General Comments General comments (skin integrity, edema, etc.): Pre session HR:77 bpm, SpO2: 93% on room air; Post session HR: 78 bpm, SpO2: 95% on room air, BP: 131/56 (78). PT inspected surgical site upon entry, drain was intact and taped down securely. Post treatment, PT inspected surgical site, drain was intact, secure and taped down, slow bleeding was noted above taped area, nurse notified immediately and assisted pt with their needs.    Exercises     Assessment/Plan    PT Assessment Patient needs continued PT services  PT Problem List Decreased strength;Decreased balance;Decreased knowledge of precautions;Pain;Decreased mobility;Decreased activity tolerance;Decreased safety awareness       PT Treatment Interventions Gait training;DME instruction;Functional mobility training;Therapeutic activities;Therapeutic exercise;Balance training;Patient/family education    PT Goals (Current goals can be found in the Care Plan section)  Acute Rehab PT Goals Patient Stated Goal: Get up and moving PT Goal Formulation: With patient Time For Goal Achievement: 07/21/23 Potential to Achieve Goals: Fair    Frequency Min 3X/week     Co-evaluation               AM-PAC PT 6 Clicks Mobility  Outcome Measure Help needed turning from your back  to your side while in a flat bed without using bedrails?: A Lot Help needed moving from lying on your back to sitting on the side of a flat bed without using bedrails?: A Little Help needed moving to and from a bed to a chair (including a wheelchair)?: A Little Help needed standing up from a chair using your arms (e.g., wheelchair or bedside chair)?: A Little Help needed to walk in hospital room?: A Little Help needed climbing 3-5 steps with a railing? : A  Lot 6 Click Score: 16    End of Session Equipment Utilized During Treatment: Gait belt Activity Tolerance: Patient tolerated treatment well;No increased pain;Patient limited by pain;Patient limited by fatigue;Other (comment) (limited by increased dizziness) Patient left: in chair;with chair alarm set;with call bell/phone within reach;with nursing/sitter in room Nurse Communication: Mobility status;Precautions PT Visit Diagnosis: Unsteadiness on feet (R26.81);Muscle weakness (generalized) (M62.81);Dizziness and giddiness (R42)    Time: 0981-1914 PT Time Calculation (min) (ACUTE ONLY): 46 min   Charges:               Jessy Cybulski, SPT 07/07/23, 1:31 PM

## 2023-07-07 NOTE — Progress Notes (Signed)
 [facility] SURGICAL PROGRESS NOTE  Hospital Day(s): 2.   Post op day(s): 2 Days Post-Op.   Interval History:  Patient seen and examined No acute events or new complaints overnight.  Patient reports she is feeling better Abdomen is sore but improved No fever, chills, nausea  WBC stable; 11.3K Hgb to 12.4; stable PLT 64K; stable  Renal function normal; sCr - 0.84; UO - 600 ccs No electrolyte derangements Surgical drain with 85 ccs; serous CLD; no issue - decreased appetite  Vital signs in last 24 hours: [min-max] current  Temp:  [98.4 F (36.9 C)-100.1 F (37.8 C)] 98.4 F (36.9 C) (06/13 0456) Pulse Rate:  [73-81] 77 (06/13 0456) Resp:  [16-19] 16 (06/13 0456) BP: (103-132)/(45-62) 126/62 (06/13 0456) SpO2:  [93 %-98 %] 93 % (06/13 0456)     Height: 5' 6 (167.6 cm) Weight: 109.8 kg BMI (Calculated): 39.08   Intake/Output last 2 shifts:  06/12 0701 - 06/13 0700 In: 1047.2 [P.O.:240; I.V.:757.2; IV Piggyback:50] Out: 685 [Urine:600; Drains:85]   Physical Exam:  Constitutional: alert, cooperative and no distress  Respiratory: breathing non-labored at rest  Cardiovascular: regular rate and sinus rhythm  Gastrointestinal: soft, incisional soreness, no rebound/guarding. Surgical drain in LLQ; serous Integumentary:  Laparoscopic incisions are CDI with dermabond, no erythema   Labs:     Latest Ref Rng & Units 07/07/2023    5:14 AM 07/06/2023    5:45 AM 07/05/2023   12:29 PM  CBC  WBC 4.0 - 10.5 K/uL 11.3  11.4  13.7   Hemoglobin 12.0 - 15.0 g/dL 16.1  09.6  04.5   Hematocrit 36.0 - 46.0 % 38.3  39.7  43.8   Platelets 150 - 400 K/uL 64  62  83       Latest Ref Rng & Units 07/07/2023    5:14 AM 07/06/2023    5:45 AM 07/05/2023   12:29 PM  CMP  Glucose 70 - 99 mg/dL 409  811  914   BUN 8 - 23 mg/dL 18  13  12    Creatinine 0.44 - 1.00 mg/dL 7.82  9.56  2.13   Sodium 135 - 145 mmol/L 137  138  138   Potassium 3.5 - 5.1 mmol/L 3.8  3.8  4.1   Chloride  98 - 111 mmol/L 102  104  102   CO2 22 - 32 mmol/L 27  26  26    Calcium 8.9 - 10.3 mg/dL 8.3  7.9  9.0   Total Protein 6.5 - 8.1 g/dL   7.6   Total Bilirubin 0.0 - 1.2 mg/dL   1.0   Alkaline Phos 38 - 126 U/L   59   AST 15 - 41 U/L   15   ALT 0 - 44 U/L   11      Imaging studies: No new pertinent imaging studies   Assessment/Plan:  68 y.o. female 2 Days Post-Op s/p robotic assisted laparoscopic appendectomy for acute perforated appendicitis   - Advance to soft diet this AM  - Continue IV Abx (Zosyn ); will certainly need PO Abx for home  - Continue surgical drain; monitor and record output  - Monitor abdominal examination; on-going bowel function   - Pain control prn; antiemetics prn  - Needs to mobilize; needs to mobilize TODAY  - Appreciate medicine assistance    - Discharge Planning: Doing better, labs stable, needs to mobilize. Anticipate home tomorrow (06/14) morning. She will need Abx and go with drain. We will follow  up next week for drain removal.   All of the above findings and recommendations were discussed with the patient, and the medical team, and all of patient's questions were answered to her expressed satisfaction.  -- Apolonio Bay, PA-C [facility] 07/07/2023, 7:27 AM M-F: 7am - 4pm

## 2023-07-07 NOTE — Progress Notes (Signed)
 PROGRESS NOTE    [name]   NWG:[id number] DOB: Apr 29, 1955  DOA: 07/05/2023 Date of Service: 07/07/23 which is hospital day 2  PCP: Becky San, MD    Hospital course / significant events:   HPI: [name] is a 68 y.o. female with medical history significant for type 2 diabetes mellitus, Von Willebrand disease type IIa and essential hypertension, who presented to the emergency room with acute onset of lower abdominal pain started on Monday morning with associated fever and chills.  She has been having anorexia with nausea without vomiting.  She has not had any bowel movements since her pain started.   06/11: CT w/ acute appendicitis with appendicolith noted. There was no definite abscess. Also hepatic cirrhosis, cholelithiasis and aortic atherosclerosis. WBC 13.7, Plt 83. Started on Zosyn . Underwent appendectomy. Hospitalist consulted for medical management  06/12: WBC improving, no concerns on BMP. Plt lower today at 62 will monitor closely 06/13: Plt stable, pain improved, mobilizing w/ therapy continuing Zosyn     ASSESSMENT & PLAN:   Appendicitis - s/p appendectomy  Per general surgery team   Type 2 diabetes mellitus without complications. SSI coverage with NovoLog . hold off oral antidiabetics for now.  Hypothyroidism. continue Synthroid .   Dyslipidemia. continue statin    Thrombocytopenia likely chronic assoc w von willebrand +/- cirrhosis, and likely worsened w/ acute inflammation  83 on admission 06/11 --> 62 today 06/12 Monitor CBC Transfuse if bleeding   Von Willebrand's disease continue tranexamic acid .   GERD without esophagitis. continue Carafate .   History of breast cancer. conitnue Tamoxifen      Class 2 obesity based on BMI: Body mass index is 39.06 kg/m.[name] Significantly low or high BMI is associated with higher medical risk.  Underweight - under 18  overweight - 25 to 29 obese - 30 or more Class 1 obesity: BMI of 30.0 to  34 Class 2 obesity: BMI of 35.0 to 39 Class 3 obesity: BMI of 40.0 to 49 Super Morbid Obesity: BMI 50-59 Super-super Morbid Obesity: BMI 60+ Healthy nutrition and physical activity advised as adjunct to other disease management and risk reduction treatments    DVT prophylaxis: SCD/ambulation IV fluids: wound not need continuous IV fluids if tolerating PO Nutrition: advance per surgery   Medical barriers to dispo: [name] Expected medical readiness for discharge per surgery team tomorrow              Subjective / Brief ROS:  Patient reports pain continues to improve Feels good to get up and move a bit more today  Denies CP/SOB.  Denies new weakness.  Tolerating diet.    Family Communication: none at this time    Objective Findings:  Vitals:   07/06/23 2041 07/07/23 0456 07/07/23 0806 07/07/23 1552  BP: (!) 132/58 126/62 (!) 138/51 (!) 149/66  Pulse: 74 77 75 65  Resp: 16 16 19 17   Temp: 100.1 F (37.8 C) 98.4 F (36.9 C) 98.4 F (36.9 C) 97.9 F (36.6 C)  TempSrc: Oral Oral    SpO2: 97% 93% 93% 97%  Weight:      Height:        Intake/Output Summary (Last 24 hours) at 07/07/2023 1627 Last data filed at 07/07/2023 1300 Gross per 24 hour  Intake 290 ml  Output 710 ml  Net -420 ml   Filed Weights   07/05/23 1228  Weight: 109.8 kg    Examination:  Physical Exam Constitutional:      General: She is not in acute  distress.  Cardiovascular:     Rate and Rhythm: Normal rate and regular rhythm.  Pulmonary:     Effort: Pulmonary effort is normal.     Breath sounds: Normal breath sounds.  Abdominal:     General: Bowel sounds are normal. There is no distension.     Palpations: Abdomen is soft.     Tenderness: There is no abdominal tenderness.   Neurological:     Mental Status: She is alert.          Scheduled Medications:   brimonidine   1 drop Both Eyes TID   dorzolamide -timolol   1 drop Both Eyes BID   insulin  aspart  0-20 Units Subcutaneous  TID AC & HS   latanoprost   1 drop Both Eyes QHS   levothyroxine   125 mcg Oral Q0600   pantoprazole  (PROTONIX ) IV  40 mg Intravenous QHS   simvastatin   40 mg Oral q1800   tamoxifen   20 mg Oral Daily    Continuous Infusions:  piperacillin -tazobactam (ZOSYN )  IV 3.375 g (07/07/23 1330)    PRN Medications:  acetaminophen , diphenhydrAMINE  **OR** diphenhydrAMINE , hydrALAZINE , HYDROmorphone  (DILAUDID ) injection, ondansetron  **OR** ondansetron  (ZOFRAN ) IV, oxyCODONE -acetaminophen , sucralfate , tranexamic acid , traZODone   Antimicrobials from admission:  Anti-infectives (From admission, onward)    Start     Dose/Rate Route Frequency Ordered Stop   07/05/23 2100  piperacillin -tazobactam (ZOSYN ) IVPB 3.375 g        3.375 g 12.5 mL/hr over 240 Minutes Intravenous Every 8 hours 07/05/23 2013 07/10/23 2159   07/05/23 1500  piperacillin -tazobactam (ZOSYN ) IVPB 3.375 g        3.375 g 100 mL/hr over 30 Minutes Intravenous  Once 07/05/23 1445 07/05/23 1637           Data Reviewed:  I have personally reviewed the following...  CBC: Recent Labs  Lab 07/05/23 1229 07/06/23 0545 07/07/23 0514  WBC 13.7* 11.4* 11.3*  HGB 14.4 13.0 12.4  HCT 43.8 39.7 38.3  MCV 95.0 95.9 97.7  PLT 83* 62* 64*   Basic Metabolic Panel: Recent Labs  Lab 07/05/23 1229 07/06/23 0545 07/07/23 0514  NA 138 138 137  K 4.1 3.8 3.8  CL 102 104 102  CO2 26 26 27   GLUCOSE 159* 176* 137*  BUN 12 13 18   CREATININE 0.77 0.87 0.84  CALCIUM 9.0 7.9* 8.3*   GFR: Estimated Creatinine Clearance: 79.3 mL/min (by C-G formula based on SCr of 0.84 mg/dL). Liver Function Tests: Recent Labs  Lab 07/05/23 1229  AST 15  ALT 11  ALKPHOS 59  BILITOT 1.0  PROT 7.6  ALBUMIN 3.4*   Recent Labs  Lab 07/05/23 1229  LIPASE 27   No results for input(s): AMMONIA in the last 168 hours. Coagulation Profile: Recent Labs  Lab 07/05/23 1544  INR 1.2   Cardiac Enzymes: No results for input(s): CKTOTAL, CKMB,  CKMBINDEX, TROPONINI in the last 168 hours. BNP (last 3 results) No results for input(s): PROBNP in the last 8760 hours. HbA1C: Recent Labs    07/05/23 1229  HGBA1C 5.5   CBG: Recent Labs  Lab 07/06/23 1537 07/06/23 2043 07/07/23 0808 07/07/23 1117 07/07/23 1554  GLUCAP 118* 130* 126* 129* 141*   Lipid Profile: No results for input(s): CHOL, HDL, LDLCALC, TRIG, CHOLHDL, LDLDIRECT in the last 72 hours. Thyroid Function Tests: No results for input(s): TSH, T4TOTAL, FREET4, T3FREE, THYROIDAB in the last 72 hours. Anemia Panel: No results for input(s): VITAMINB12, FOLATE, FERRITIN, TIBC, IRON, RETICCTPCT in the last 72 hours. Most Recent Urinalysis  On File:     Component Value Date/Time   COLORURINE YELLOW (A) 07/05/2023 1229   APPEARANCEUR HAZY (A) 07/05/2023 1229   APPEARANCEUR Clear 06/17/2011 1630   LABSPEC 1.024 07/05/2023 1229   LABSPEC 1.019 06/17/2011 1630   PHURINE 5.0 07/05/2023 1229   GLUCOSEU NEGATIVE 07/05/2023 1229   GLUCOSEU Negative 06/17/2011 1630   HGBUR MODERATE (A) 07/05/2023 1229   BILIRUBINUR NEGATIVE 07/05/2023 1229   BILIRUBINUR Negative 06/17/2011 1630   KETONESUR NEGATIVE 07/05/2023 1229   PROTEINUR 30 (A) 07/05/2023 1229   NITRITE NEGATIVE 07/05/2023 1229   LEUKOCYTESUR TRACE (A) 07/05/2023 1229   LEUKOCYTESUR Negative 06/17/2011 1630   Sepsis Labs: @LABRCNTIP (procalcitonin:4,lacticidven:4) Microbiology: No results found for this or any previous visit (from the past 240 hours).    Radiology Studies last 3 days: US  Venous Img Lower Bilateral (DVT) Result Date: 07/06/2023 CLINICAL DATA:  Leg swelling EXAM: BILATERAL LOWER EXTREMITY VENOUS DOPPLER ULTRASOUND TECHNIQUE: Gray-scale sonography with graded compression, as well as color Doppler and duplex ultrasound were performed to evaluate the lower extremity deep venous systems from the level of the common femoral vein and including the common femoral,  femoral, profunda femoral, popliteal and calf veins including the posterior tibial, peroneal and gastrocnemius veins when visible. The superficial great saphenous vein was also interrogated. Spectral Doppler was utilized to evaluate flow at rest and with distal augmentation maneuvers in the common femoral, femoral and popliteal veins. COMPARISON:  None Available. FINDINGS: RIGHT LOWER EXTREMITY Common Femoral Vein: No evidence of thrombus. Normal compressibility, respiratory phasicity and response to augmentation. Saphenofemoral Junction: No evidence of thrombus. Normal compressibility and flow on color Doppler imaging. Profunda Femoral Vein: No evidence of thrombus. Normal compressibility and flow on color Doppler imaging. Femoral Vein: No evidence of thrombus. Normal compressibility, respiratory phasicity and response to augmentation. Popliteal Vein: No evidence of thrombus. Normal compressibility, respiratory phasicity and response to augmentation. Calf Veins: No evidence of thrombus. Normal compressibility and flow on color Doppler imaging. Superficial Great Saphenous Vein: No evidence of thrombus. Normal compressibility. Venous Reflux:  None. Other Findings:  None. LEFT LOWER EXTREMITY Common Femoral Vein: No evidence of thrombus. Normal compressibility, respiratory phasicity and response to augmentation. Saphenofemoral Junction: No evidence of thrombus. Normal compressibility and flow on color Doppler imaging. Profunda Femoral Vein: No evidence of thrombus. Normal compressibility and flow on color Doppler imaging. Femoral Vein: No evidence of thrombus. Normal compressibility, respiratory phasicity and response to augmentation. Popliteal Vein: No evidence of thrombus. Normal compressibility, respiratory phasicity and response to augmentation. Calf Veins: No evidence of thrombus. Normal compressibility and flow on color Doppler imaging. Superficial Great Saphenous Vein: No evidence of thrombus. Normal  compressibility. Venous Reflux:  None. Other Findings:  None. IMPRESSION: No evidence of deep venous thrombosis in either lower extremity. Electronically Signed   By: Adrianna Horde M.D.   On: 07/06/2023 10:48   CT ABDOMEN PELVIS W CONTRAST Result Date: 07/05/2023 CLINICAL DATA:  Acute lower abdominal pain. EXAM: CT ABDOMEN AND PELVIS WITH CONTRAST TECHNIQUE: Multidetector CT imaging of the abdomen and pelvis was performed using the standard protocol following bolus administration of intravenous contrast. RADIATION DOSE REDUCTION: This exam was performed according to the departmental dose-optimization program which includes automated exposure control, adjustment of the mA and/or kV according to patient size and/or use of iterative reconstruction technique. CONTRAST:  OMNIPAQUE  IOHEXOL  300 MG/ML  SOLN COMPARISON:  March 21, 2005. FINDINGS: Lower chest: No acute abnormality. Hepatobiliary: Cholelithiasis. No biliary dilatation. Nodular hepatic contours are noted suggesting hepatic  cirrhosis. Pancreas: Unremarkable. No pancreatic ductal dilatation or surrounding inflammatory changes. Spleen: Normal in size without focal abnormality. Adrenals/Urinary Tract: Adrenal glands are unremarkable. Kidneys are normal, without renal calculi, focal lesion, or hydronephrosis. Bladder is unremarkable. Stomach/Bowel: The stomach is unremarkable. There is no evidence of bowel obstruction. Severe thickening and inflammatory changes are seen involving the appendix with appendicolith present. This is consistent with appendicitis without definite abscess formation. Appendix has maximum measured diameter of 19 mm. Vascular/Lymphatic: Aortic atherosclerosis. No enlarged abdominal or pelvic lymph nodes. Reproductive: Uterus is unremarkable. No adnexal abnormality is noted. Other: No ascites or hernia is noted. Musculoskeletal: No acute or significant osseous findings. IMPRESSION: Findings consistent with acute appendicitis.  Appendicolith is noted. No definite abscess is noted. Hepatic cirrhosis. Cholelithiasis. Aortic Atherosclerosis (ICD10-I70.0). Electronically Signed   By: Rosalene Colon M.D.   On: 07/05/2023 14:35          Alexi Geibel, DO [facility] 07/07/2023, 4:27 PM    Dictation software may have been used to generate the above note. Typos may occur and escape review in typed/dictated notes. Please contact Dr Authur Leghorn directly for clarity if needed.  Staff may message me via secure chat in Epic  but this may not receive an immediate response,  please page me for urgent matters!  If 7PM-7AM, please contact night coverage [email or web address]

## 2023-07-07 NOTE — TOC Initial Note (Signed)
 Transition of Care [facility]) - Initial/Assessment Note    Patient Details  Name: [name] MRN: [id number] Date of Birth: 1955/10/17  [facility] of [facility] CM/SW Contact:    Loman Risk, RN Phone Number: 07/07/2023, 4:00 PM  Clinical Narrative:                  Admitted for: s/p lap appy Admitted from: home with husband and 2 adult daughters PCP: Dean Every Current home health/prior home health/DME:Shower seat;Cane - quad;Cane - single point;BSC/3in1    RW to be ordered closer to discharge.  Patient is not sure if she received one in the last 5 years.  The one she had, she gave to a neighbor  Therapy recommending SNF.  Patient agreeable to home health, states she doesn't have a preference of agency.  Referral made to Shaun      Patient Goals and CMS Choice            Expected Discharge Plan and Services                                              Prior Living Arrangements/Services                       Activities of Daily Living   ADL Screening (condition at time of admission) Independently performs ADLs?: No Does the patient have a NEW difficulty with bathing/dressing/toileting/self-feeding that is expected to last >3 days?: Yes (Initiates electronic notice to provider for possible OT consult) Does the patient have a NEW difficulty with getting in/out of bed, walking, or climbing stairs that is expected to last >3 days?: Yes (Initiates electronic notice to provider for possible PT consult) Does the patient have a NEW difficulty with communication that is expected to last >3 days?: No Is the patient deaf or have difficulty hearing?: No Does the patient have difficulty seeing, even when wearing glasses/contacts?: No Does the patient have difficulty concentrating, remembering, or making decisions?: No  Permission Sought/Granted                  Emotional Assessment              Admission diagnosis:  Acute appendicitis,  unspecified acute appendicitis type [K35.80] Acute appendicitis [K35.80] Patient Active Problem List   Diagnosis Date Noted   Acute perforated appendicitis 07/05/2023   Type 2A von Willebrand's disease (HCC) 07/05/2023   Hypothyroidism 07/05/2023   Acute appendicitis 07/05/2023   Dyslipidemia 07/05/2023   Pain and swelling of lower leg 06/22/2020   Lymphedema 06/22/2020   Essential hypertension 06/22/2020   Hyperlipidemia 06/22/2020   Diabetes (HCC) 06/22/2020   PCP:  Lyle San, MD Pharmacy:   [id number] [place], [place] - [address] [place] [address] Phone: [phone] Fax: [phone]     Social Drivers of Health (SDOH) Social History: SDOH Screenings   Food Insecurity: No Food Insecurity (07/05/2023)  Housing: Low Risk  (07/05/2023)  Transportation Needs: No Transportation Needs (07/05/2023)  Utilities: Not At Risk (07/05/2023)  Financial Resource Strain: Low Risk  (03/02/2023)   [facility]  Physical Activity: Inactive (03/02/2018)   Received from [facility]  Social Connections: Socially Integrated (07/05/2023)  Stress: No Stress Concern Present (03/02/2018)   Received from [facility]  Tobacco Use: Low Risk  (07/05/2023)  Health Literacy: Low Risk  (05/03/2020)   [facility]   SDOH Interventions:     Readmission Risk Interventions     No data to display

## 2023-07-07 NOTE — Care Management Important Message (Signed)
 Important Message  Patient Details  Name: [name] MRN: [id number] Date of Birth: Feb 13, 1955   Important Message Given:  Yes - Medicare IM     Anise Kerns 07/07/2023, 2:43 PM

## 2023-07-07 NOTE — Discharge Instructions (Signed)
 In addition to included general post-operative instructions,  Diet: Resume home diet.   Activity: No heavy lifting >20 pounds (children, pets, laundry, garbage) or strenuous activity for 4 weeks, but light activity and walking are encouraged. Do not drive or drink alcohol if taking narcotic pain medications or having pain that might distract from driving.  Drain: Monitor and record drain output daily; Hand out given for this. Please bring this to follow up appointment.   Wound care: If you can keep drain site covered and secured, you may shower/get incision wet with soapy water and pat dry (do not rub incisions), but no baths or submerging incision underwater until follow-up.   Medications: Resume all home medications. For mild to moderate pain: acetaminophen  (Tylenol ) or ibuprofen/naproxen (if no kidney disease). Combining Tylenol  with alcohol can substantially increase your risk of causing liver disease. Narcotic pain medications, if prescribed, can be used for severe pain, though may cause nausea, constipation, and drowsiness. Do not combine Tylenol  and Percocet (or similar) within a 6 hour period as Percocet (and similar) contain(s) Tylenol . If you do not need the narcotic pain medication, you do not need to fill the prescription.  Call office [phone] / [phone]) at any time if any questions, worsening pain, fevers/chills, bleeding, drainage from incision site, or other concerns.

## 2023-07-08 ENCOUNTER — Other Ambulatory Visit: Payer: Self-pay

## 2023-07-08 ENCOUNTER — Emergency Department
Admission: EM | Admit: 2023-07-08 | Discharge: 2023-07-09 | Disposition: A | Discharge status: Home / Self Care | Attending: Emergency Medicine | Admitting: Emergency Medicine

## 2023-07-08 DIAGNOSIS — Z5189 Encounter for other specified aftercare: Secondary | ICD-10-CM

## 2023-07-08 DIAGNOSIS — Z4801 Encounter for change or removal of surgical wound dressing: Secondary | ICD-10-CM | POA: Insufficient documentation

## 2023-07-08 DIAGNOSIS — K3532 Acute appendicitis with perforation and localized peritonitis, without abscess: Secondary | ICD-10-CM | POA: Diagnosis not present

## 2023-07-08 LAB — COMPREHENSIVE METABOLIC PANEL WITH GFR
ALT: 8 U/L (ref 0–44)
AST: 16 U/L (ref 15–41)
Albumin: 2.7 g/dL — ABNORMAL LOW (ref 3.5–5.0)
Alkaline Phosphatase: 52 U/L (ref 38–126)
Anion gap: 12 (ref 5–15)
BUN: 16 mg/dL (ref 8–23)
CO2: 25 mmol/L (ref 22–32)
Calcium: 8.4 mg/dL — ABNORMAL LOW (ref 8.9–10.3)
Chloride: 99 mmol/L (ref 98–111)
Creatinine, Ser: 0.76 mg/dL (ref 0.44–1.00)
GFR, Estimated: >60 mL/min (ref >60)
Glucose, Bld: 133 mg/dL — ABNORMAL HIGH (ref 70–99)
Potassium: 4 mmol/L (ref 3.5–5.1)
Sodium: 136 mmol/L (ref 135–145)
Total Bilirubin: 1 mg/dL (ref 0.0–1.2)
Total Protein: 6.6 g/dL (ref 6.5–8.1)

## 2023-07-08 LAB — CBC
HCT: 39.4 % (ref 36.0–46.0)
HCT: 39.5 % (ref 36.0–46.0)
Hemoglobin: 12.7 g/dL (ref 12.0–15.0)
Hemoglobin: 12.3 g/dL (ref 12.0–15.0)
MCH: 31.3 pg (ref 26.0–34.0)
MCH: 30.5 pg (ref 26.0–34.0)
MCHC: 32.2 g/dL (ref 30.0–36.0)
MCHC: 31.1 g/dL (ref 30.0–36.0)
MCV: 97 fL (ref 80.0–100.0)
MCV: 98 fL (ref 80.0–100.0)
Platelets: 46 10*3/uL — ABNORMAL LOW (ref 150–400)
Platelets: 68 10*3/uL — ABNORMAL LOW (ref 150–400)
RBC: 4.06 MIL/uL (ref 3.87–5.11)
RBC: 4.03 MIL/uL (ref 3.87–5.11)
RDW: 13.7 % (ref 11.5–15.5)
RDW: 13.6 % (ref 11.5–15.5)
WBC: 10.2 10*3/uL (ref 4.0–10.5)
WBC: 9.9 10*3/uL (ref 4.0–10.5)
nRBC: 0 % (ref 0.0–0.2)
nRBC: 0 % (ref 0.0–0.2)

## 2023-07-08 LAB — GLUCOSE, CAPILLARY
Glucose-Capillary: 128 mg/dL — ABNORMAL HIGH (ref 70–99)
Glucose-Capillary: 205 mg/dL — ABNORMAL HIGH (ref 70–99)

## 2023-07-08 MED ORDER — OXYCODONE-ACETAMINOPHEN 5-325 MG PO TABS: 1.0000 | ORAL_TABLET | ORAL | 0 refills | Status: DC | PRN

## 2023-07-08 MED ORDER — AMOXICILLIN-POT CLAVULANATE 875-125 MG PO TABS: 1.0000 | ORAL_TABLET | Freq: Two times a day (BID) | ORAL | 0 refills | Status: AC

## 2023-07-08 NOTE — TOC Transition Note (Signed)
 Transition of Care [facility]) - Discharge Note   Patient Details  Name: [name] MRN: [id number] Date of Birth: 1955/03/13  [facility] CM/SW Contact:  Zoe Hinds, RN Phone Number: 07/08/2023, 2:14 PM   Clinical Narrative:    Pt medically cleared for dc per MD order. This CM spoke with pt regarding DC DME order and  confirmed pt did not have preference. This CM called and spoke with Aida & coordinated DME RW  to be delivered bedside. Pt denied wanting HH services coordinated per recommendation and MD. Pt confirmed dc transportation being provided by family. No additional TOC needs requested from CM for pt's dc .    Final next level of care: Home/Self Care Barriers to Discharge: No Barriers Identified     Name of family member notified: Patient               DME Arranged: Walker rolling DME Agency: [employer] Date DME Agency Contacted: 07/08/23 Time DME Agency Contacted: 1110 Representative spoke with at DME Agency: Aida            Social Drivers of Health (SDOH) Interventions SDOH Screenings   Food Insecurity: No Food Insecurity (07/05/2023)  Housing: Low Risk  (07/05/2023)  Transportation Needs: No Transportation Needs (07/05/2023)  Utilities: Not At Risk (07/05/2023)  Financial Resource Strain: Low Risk  (03/02/2023)   [facility]  Physical Activity: Inactive (03/02/2018)   Received from [facility]  Social Connections: Socially Integrated (07/05/2023)  Stress: No Stress Concern Present (03/02/2018)   Received from [facility]  Tobacco Use: Low Risk  (07/05/2023)  Health Literacy: Low Risk  (05/03/2020)   Received from [facility]     Readmission Risk Interventions     No data to display

## 2023-07-08 NOTE — Progress Notes (Signed)
 PROGRESS NOTE    [name]   VHQ:[id number] DOB: 10/13/1955  DOA: 07/05/2023 Date of Service: 07/08/23 which is hospital day 3  PCP: Lyle San, MD    Hospital course / significant events:   HPI: [name] is a 68 y.o. female with medical history significant for type 2 diabetes mellitus, Von Willebrand disease type IIa and essential hypertension, who presented to the emergency room with acute onset of lower abdominal pain started on Monday morning with associated fever and chills.  She has been having anorexia with nausea without vomiting.  She has not had any bowel movements since her pain started.   06/11: CT w/ acute appendicitis with appendicolith noted. There was no definite abscess. Also hepatic cirrhosis, cholelithiasis and aortic atherosclerosis. WBC 13.7, Plt 83. Started on Zosyn . Underwent appendectomy. Hospitalist consulted for medical management  06/12: WBC improving, no concerns on BMP. Plt lower today at 62 will monitor closely 06/13: Plt stable, pain improved, mobilizing w/ therapy continuing Zosyn  06/14: Plt lower today but no bleeding. Will need close outpatient follow up for CBC check     ASSESSMENT & PLAN:   Appendicitis - s/p appendectomy  Per general surgery team   Type 2 diabetes mellitus without complications. Resume home medications  Hypothyroidism. continue Synthroid .   Dyslipidemia. continue statin    Thrombocytopenia likely chronic assoc w von willebrand +/- cirrhosis, and likely worsened w/ acute inflammation  Monitor CBC - will need close outpatient follow up  Transfuse if bleeding   Von Willebrand's disease continue tranexamic acid .   GERD without esophagitis. continue Carafate .   History of breast cancer. conitnue Tamoxifen      Class 2 obesity based on BMI: Body mass index is 39.06 kg/m.[name] Significantly low or high BMI is associated with higher medical risk.  Underweight - under 18  overweight - 25 to 29 obese - 30  or more Class 1 obesity: BMI of 30.0 to 34 Class 2 obesity: BMI of 35.0 to 39 Class 3 obesity: BMI of 40.0 to 49 Super Morbid Obesity: BMI 50-59 Super-super Morbid Obesity: BMI 60+ Healthy nutrition and physical activity advised as adjunct to other disease management and risk reduction treatments             Subjective / Brief ROS:  Patient reports pain continues to improve Feels good to get up and move a bit more today  Has declined home PT/OT but does request walker if possible Denies CP/SOB.  Denies new weakness.  Tolerating diet.    Family Communication: none at this time    Objective Findings:  Vitals:   07/07/23 1552 07/07/23 2132 07/08/23 0434 07/08/23 0806  BP: (!) 149/66 (!) 138/54 105/87 (!) 131/51  Pulse: 65 69 72 72  Resp: 17 16 16 17   Temp: 97.9 F (36.6 C) 98.1 F (36.7 C) 98.3 F (36.8 C) 97.9 F (36.6 C)  TempSrc:    Oral  SpO2: 97% 94% 92% 90%  Weight:      Height:        Intake/Output Summary (Last 24 hours) at 07/08/2023 1159 Last data filed at 07/08/2023 1034 Gross per 24 hour  Intake 353.07 ml  Output 30 ml  Net 323.07 ml   Filed Weights   07/05/23 1228  Weight: 109.8 kg    Examination:  Physical Exam Constitutional:      General: She is not in acute distress.  Cardiovascular:     Rate and Rhythm: Normal rate and regular rhythm.  Pulmonary:  Effort: Pulmonary effort is normal.     Breath sounds: Normal breath sounds.  Abdominal:     General: Bowel sounds are normal. There is no distension.     Palpations: Abdomen is soft.     Tenderness: There is no abdominal tenderness.   Neurological:     Mental Status: She is alert.          Scheduled Medications:   brimonidine   1 drop Both Eyes TID   dorzolamide -timolol   1 drop Both Eyes BID   insulin  aspart  0-20 Units Subcutaneous TID AC & HS   latanoprost   1 drop Both Eyes QHS   levothyroxine   125 mcg Oral Q0600   pantoprazole  (PROTONIX ) IV  40 mg Intravenous QHS    simvastatin   40 mg Oral q1800   tamoxifen   20 mg Oral Daily    Continuous Infusions:  piperacillin -tazobactam (ZOSYN )  IV Stopped (07/08/23 0934)    PRN Medications:  acetaminophen , diphenhydrAMINE  **OR** diphenhydrAMINE , hydrALAZINE , HYDROmorphone  (DILAUDID ) injection, ondansetron  **OR** ondansetron  (ZOFRAN ) IV, oxyCODONE -acetaminophen , sucralfate , tranexamic acid , traZODone   Antimicrobials from admission:  Anti-infectives (From admission, onward)    Start     Dose/Rate Route Frequency Ordered Stop   07/08/23 0000  amoxicillin-clavulanate (AUGMENTIN) 875-125 MG tablet        1 tablet Oral 2 times daily 07/08/23 1054     07/05/23 2100  piperacillin -tazobactam (ZOSYN ) IVPB 3.375 g        3.375 g 12.5 mL/hr over 240 Minutes Intravenous Every 8 hours 07/05/23 2013 07/10/23 2159   07/05/23 1500  piperacillin -tazobactam (ZOSYN ) IVPB 3.375 g        3.375 g 100 mL/hr over 30 Minutes Intravenous  Once 07/05/23 1445 07/05/23 1637           Data Reviewed:  I have personally reviewed the following...  CBC: Recent Labs  Lab 07/05/23 1229 07/06/23 0545 07/07/23 0514 07/08/23 0424  WBC 13.7* 11.4* 11.3* 10.2  HGB 14.4 13.0 12.4 12.7  HCT 43.8 39.7 38.3 39.4  MCV 95.0 95.9 97.7 97.0  PLT 83* 62* 64* 46*   Basic Metabolic Panel: Recent Labs  Lab 07/05/23 1229 07/06/23 0545 07/07/23 0514  NA 138 138 137  K 4.1 3.8 3.8  CL 102 104 102  CO2 26 26 27   GLUCOSE 159* 176* 137*  BUN 12 13 18   CREATININE 0.77 0.87 0.84  CALCIUM 9.0 7.9* 8.3*   GFR: Estimated Creatinine Clearance: 79.3 mL/min (by C-G formula based on SCr of 0.84 mg/dL). Liver Function Tests: Recent Labs  Lab 07/05/23 1229  AST 15  ALT 11  ALKPHOS 59  BILITOT 1.0  PROT 7.6  ALBUMIN 3.4*   Recent Labs  Lab 07/05/23 1229  LIPASE 27   No results for input(s): AMMONIA in the last 168 hours. Coagulation Profile: Recent Labs  Lab 07/05/23 1544  INR 1.2   Cardiac Enzymes: No results for  input(s): CKTOTAL, CKMB, CKMBINDEX, TROPONINI in the last 168 hours. BNP (last 3 results) No results for input(s): PROBNP in the last 8760 hours. HbA1C: Recent Labs    07/05/23 1229  HGBA1C 5.5   CBG: Recent Labs  Lab 07/07/23 1117 07/07/23 1554 07/07/23 2135 07/08/23 0828 07/08/23 1150  GLUCAP 129* 141* 110* 128* 205*   Lipid Profile: No results for input(s): CHOL, HDL, LDLCALC, TRIG, CHOLHDL, LDLDIRECT in the last 72 hours. Thyroid Function Tests: No results for input(s): TSH, T4TOTAL, FREET4, T3FREE, THYROIDAB in the last 72 hours. Anemia Panel: No results for input(s): VITAMINB12, FOLATE,  FERRITIN, TIBC, IRON, RETICCTPCT in the last 72 hours. Most Recent Urinalysis On File:     Component Value Date/Time   COLORURINE YELLOW (A) 07/05/2023 1229   APPEARANCEUR HAZY (A) 07/05/2023 1229   APPEARANCEUR Clear 06/17/2011 1630   LABSPEC 1.024 07/05/2023 1229   LABSPEC 1.019 06/17/2011 1630   PHURINE 5.0 07/05/2023 1229   GLUCOSEU NEGATIVE 07/05/2023 1229   GLUCOSEU Negative 06/17/2011 1630   HGBUR MODERATE (A) 07/05/2023 1229   BILIRUBINUR NEGATIVE 07/05/2023 1229   BILIRUBINUR Negative 06/17/2011 1630   KETONESUR NEGATIVE 07/05/2023 1229   PROTEINUR 30 (A) 07/05/2023 1229   NITRITE NEGATIVE 07/05/2023 1229   LEUKOCYTESUR TRACE (A) 07/05/2023 1229   LEUKOCYTESUR Negative 06/17/2011 1630   Sepsis Labs: @LABRCNTIP (procalcitonin:4,lacticidven:4) Microbiology: No results found for this or any previous visit (from the past 240 hours).    Radiology Studies last 3 days: US  Venous Img Lower Bilateral (DVT) Result Date: 07/06/2023 CLINICAL DATA:  Leg swelling EXAM: BILATERAL LOWER EXTREMITY VENOUS DOPPLER ULTRASOUND TECHNIQUE: Gray-scale sonography with graded compression, as well as color Doppler and duplex ultrasound were performed to evaluate the lower extremity deep venous systems from the level of the common femoral vein and  including the common femoral, femoral, profunda femoral, popliteal and calf veins including the posterior tibial, peroneal and gastrocnemius veins when visible. The superficial great saphenous vein was also interrogated. Spectral Doppler was utilized to evaluate flow at rest and with distal augmentation maneuvers in the common femoral, femoral and popliteal veins. COMPARISON:  None Available. FINDINGS: RIGHT LOWER EXTREMITY Common Femoral Vein: No evidence of thrombus. Normal compressibility, respiratory phasicity and response to augmentation. Saphenofemoral Junction: No evidence of thrombus. Normal compressibility and flow on color Doppler imaging. Profunda Femoral Vein: No evidence of thrombus. Normal compressibility and flow on color Doppler imaging. Femoral Vein: No evidence of thrombus. Normal compressibility, respiratory phasicity and response to augmentation. Popliteal Vein: No evidence of thrombus. Normal compressibility, respiratory phasicity and response to augmentation. Calf Veins: No evidence of thrombus. Normal compressibility and flow on color Doppler imaging. Superficial Great Saphenous Vein: No evidence of thrombus. Normal compressibility. Venous Reflux:  None. Other Findings:  None. LEFT LOWER EXTREMITY Common Femoral Vein: No evidence of thrombus. Normal compressibility, respiratory phasicity and response to augmentation. Saphenofemoral Junction: No evidence of thrombus. Normal compressibility and flow on color Doppler imaging. Profunda Femoral Vein: No evidence of thrombus. Normal compressibility and flow on color Doppler imaging. Femoral Vein: No evidence of thrombus. Normal compressibility, respiratory phasicity and response to augmentation. Popliteal Vein: No evidence of thrombus. Normal compressibility, respiratory phasicity and response to augmentation. Calf Veins: No evidence of thrombus. Normal compressibility and flow on color Doppler imaging. Superficial Great Saphenous Vein: No evidence  of thrombus. Normal compressibility. Venous Reflux:  None. Other Findings:  None. IMPRESSION: No evidence of deep venous thrombosis in either lower extremity. Electronically Signed   By: Adrianna Horde M.D.   On: 07/06/2023 10:48   CT ABDOMEN PELVIS W CONTRAST Result Date: 07/05/2023 CLINICAL DATA:  Acute lower abdominal pain. EXAM: CT ABDOMEN AND PELVIS WITH CONTRAST TECHNIQUE: Multidetector CT imaging of the abdomen and pelvis was performed using the standard protocol following bolus administration of intravenous contrast. RADIATION DOSE REDUCTION: This exam was performed according to the departmental dose-optimization program which includes automated exposure control, adjustment of the mA and/or kV according to patient size and/or use of iterative reconstruction technique. CONTRAST:  OMNIPAQUE  IOHEXOL  300 MG/ML  SOLN COMPARISON:  March 21, 2005. FINDINGS: Lower chest: No acute abnormality.  Hepatobiliary: Cholelithiasis. No biliary dilatation. Nodular hepatic contours are noted suggesting hepatic cirrhosis. Pancreas: Unremarkable. No pancreatic ductal dilatation or surrounding inflammatory changes. Spleen: Normal in size without focal abnormality. Adrenals/Urinary Tract: Adrenal glands are unremarkable. Kidneys are normal, without renal calculi, focal lesion, or hydronephrosis. Bladder is unremarkable. Stomach/Bowel: The stomach is unremarkable. There is no evidence of bowel obstruction. Severe thickening and inflammatory changes are seen involving the appendix with appendicolith present. This is consistent with appendicitis without definite abscess formation. Appendix has maximum measured diameter of 19 mm. Vascular/Lymphatic: Aortic atherosclerosis. No enlarged abdominal or pelvic lymph nodes. Reproductive: Uterus is unremarkable. No adnexal abnormality is noted. Other: No ascites or hernia is noted. Musculoskeletal: No acute or significant osseous findings. IMPRESSION: Findings consistent with acute  appendicitis. Appendicolith is noted. No definite abscess is noted. Hepatic cirrhosis. Cholelithiasis. Aortic Atherosclerosis (ICD10-I70.0). Electronically Signed   By: Rosalene Colon M.D.   On: 07/05/2023 14:35          Thomasine Klutts, DO [facility] 07/08/2023, 11:59 AM    Dictation software may have been used to generate the above note. Typos may occur and escape review in typed/dictated notes. Please contact Dr Authur Leghorn directly for clarity if needed.  Staff may message me via secure chat in Epic  but this may not receive an immediate response,  please page me for urgent matters!  If 7PM-7AM, please contact night coverage [email or web address]

## 2023-07-08 NOTE — Progress Notes (Signed)
 POD # 3 Doing well Ambulated + PO  PE NAD Abd: soft, incision c/d/I, no infection, drain serous no peritonitis   A/P Doing well Ok to DC No surgical complications F/U w us 

## 2023-07-08 NOTE — ED Provider Notes (Signed)
 [facility] Provider Note    Event Date/Time   First MD Initiated Contact with Patient 07/08/23 2253     (approximate)   History   Wound Check   HPI [name] is a 69 y.o. female discharged earlier today from the surgery service after an appendectomy about a week ago.  She presents tonight for wound check.  She is concerned because the JP drain in her left lower quadrant is draining less fluid than before and it is leaking around the wound site.  She has steady pain since the surgery, no worsening pain.  No fever.  No vomiting.     Physical Exam   Triage Vital Signs: ED Triage Vitals  Encounter Vitals Group     BP 07/08/23 2129 132/63     Girls Systolic BP Percentile --      Girls Diastolic BP Percentile --      Boys Systolic BP Percentile --      Boys Diastolic BP Percentile --      Pulse Rate 07/08/23 2129 74     Resp 07/08/23 2129 18     Temp 07/08/23 2129 98.7 F (37.1 C)     Temp Source 07/08/23 2129 Oral     SpO2 07/08/23 2129 95 %     Weight 07/08/23 2129 109.8 kg (242 lb)     Height 07/08/23 2129 1.676 m (5' 6)     Head Circumference --      Peak Flow --      Pain Score 07/08/23 2135 8     Pain Loc --      Pain Education --      Exclude from Growth Chart --     Most recent vital signs: Vitals:   07/08/23 2129  BP: 132/63  Pulse: 74  Resp: 18  Temp: 98.7 F (37.1 C)  SpO2: 95%    General: Awake, no distress.  CV:  Good peripheral perfusion.  Resp:  Normal effort. Speaking easily and comfortably, no accessory muscle usage nor intercostal retractions.   Abd:  morbid obesity.  Well-appearing laparoscopic surgical wounds, no wound dehiscence.  JP drain has a small amount of serosanguineous fluid and new fluid in the tube.  Tube is well-secured.  The incision through which the JP drain tube passes is a bit larger than the tube so there is some serosanguineous fluid leaking from around it.  However the wound is  well-appearing with no evidence of infection.   ED Results / Procedures / Treatments   Labs (all labs ordered are listed, but only abnormal results are displayed) Labs Reviewed  CBC - Abnormal; Notable for the following components:      Result Value   Platelets 68 (*)    All other components within normal limits  COMPREHENSIVE METABOLIC PANEL WITH GFR - Abnormal; Notable for the following components:   Glucose, Bld 133 (*)    Calcium 8.4 (*)    Albumin 2.7 (*)    All other components within normal limits       PROCEDURES:  Critical Care performed: No  Procedures    IMPRESSION / MDM / ASSESSMENT AND PLAN / ED COURSE  I reviewed the triage vital signs and the nursing notes.                              Differential diagnosis includes, but is not limited to, wound dehiscence, infection, JP  drain failure.  Patient's presentation is most consistent with acute presentation with potential threat to life or bodily function.    Drain seems to be functioning appropriately and there is no evidence of wound complications.  Given that she is recently postop and discharged, I consulted by phone with Dr. Dana Duncan with general surgery.  He knows the patient well and confirmed that everything is working appropriately based on my description over the phone.  He recommended against placing an additional securing suture so that the wound can drain is much as possible both via the bulb and just from the wound itself.  I provided reassurance to the patient and personally redressed the wound that has the drainage tube.  She understands and agrees with the plan and will follow-up as planned with surgery for outpatient checkup.       FINAL CLINICAL IMPRESSION(S) / ED DIAGNOSES   Final diagnoses:  Visit for wound check     Rx / DC Orders   ED Discharge Orders     None        Note:  This document was prepared using Dragon voice recognition software and may include unintentional  dictation errors.   Lynnda Sas, MD 07/08/23 [phone]

## 2023-07-08 NOTE — ED Triage Notes (Signed)
 Pt presents via POV c/o bulb drain not draining from surgical wound. Reports Feels like stitches have came out as well. Reports minimal drainage from surgical wound.   Reports has an appendectomy on Wednesday.

## 2023-07-08 NOTE — Progress Notes (Signed)
 Physical Therapy Treatment Patient Details Name: [name] MRN: [id number] DOB: 04-04-1955 Today's Date: 07/08/2023   History of Present Illness Pt is a 68 y.o. female who presents to the ED on 07/05/23 with lower abdominal pain with admitting diagnosis of acute appendicitis s/p appendectomy. PMH: type 2 diabetes mellitus, Von Willebrand disease type IIa, essential hypertension, hypothyroidism, breast CA, anorexia with nausea, gallstones, oophorectomy.    PT Comments  Pt resting in bed upon PT arrival; pt agreeable to therapy (pt requesting to toilet).  During session pt was SBA semi-supine to sitting EOB; min assist progressing to CGA with transfers; and CGA to ambulate 40 feet with RW use (limited distance d/t pt fatigue, generalized weakness, and c/o dizziness).  Post ambulation (in sitting) pt's HR 78 bpm, SpO2 sats 92% on room air, and BP 135/50 (MAP 74); pt still reporting mild dizziness in sitting but reported she was better and appeared comfortable in recliner; nurse updated on pt's status/symptoms and BP.  Will continue to focus on strengthening, balance, and progressive functional mobility during hospitalization.    If plan is discharge home, recommend the following: A little help with walking and/or transfers;A little help with bathing/dressing/bathroom;Assistance with cooking/housework;Assist for transportation;Help with stairs or ramp for entrance   Can travel by private vehicle     No  Equipment Recommendations  Rolling walker (2 wheels)    Recommendations for Other Services       Precautions / Restrictions Precautions Precautions: Fall Recall of Precautions/Restrictions: Intact Precaution/Restrictions Comments: abdominal precautions for comfort; JP drain Restrictions Weight Bearing Restrictions Per Provider Order: No     Mobility  Bed Mobility Overal bed mobility: Needs Assistance Bed Mobility: Rolling, Sidelying to Sit Rolling: Supervision, Used  rails Sidelying to sit: Supervision, Used rails       General bed mobility comments: mild increased effort/time to perform on own    Transfers Overall transfer level: Needs assistance Equipment used: Rolling walker (2 wheels) Transfers: Sit to/from Stand, Bed to chair/wheelchair/BSC Sit to Stand: Min assist, Contact guard assist   Step pivot transfers: Contact guard assist       General transfer comment: min assist to stand from bed; CGA to stand from [facility] and recliner x1 trial each; vc's for UE positioning/placement; stand step turn bed to [facility] (no AD use) and BSC to recliner (with RW use) CGA    Ambulation/Gait Ambulation/Gait assistance: Contact guard assist Gait Distance (Feet): 40 Feet Assistive device: Rolling walker (2 wheels) Gait Pattern/deviations: Step-through pattern, Decreased step length - right, Decreased step length - left Gait velocity: decreased     General Gait Details: flexed posture; limited d/t pt fatigue, generalized weakness, and dizziness   Stairs             Wheelchair Mobility     Tilt Bed    Modified Rankin (Stroke Patients Only)       Balance Overall balance assessment: Needs assistance Sitting-balance support: No upper extremity supported, Feet supported Sitting balance-Leahy Scale: Good Sitting balance - Comments: steady reaching within BOS   Standing balance support: Single extremity supported Standing balance-Leahy Scale: Fair Standing balance comment: pt able to adjust underwear in standing with single UE support                            Communication Communication Communication: No apparent difficulties  Cognition Arousal: Alert Behavior During Therapy: WFL for tasks assessed/performed   PT - Cognitive impairments: No apparent impairments  Following commands: Intact      Cueing Cueing Techniques: Verbal cues  Exercises      General Comments General comments  (skin integrity, edema, etc.): No drainage noted L abdominal dressing beginning/end of session; L lower abdominal JP drain in place.  Nursing cleared pt for participation in physical therapy.  Pt agreeable to PT session.      Pertinent Vitals/Pain Pain Assessment Pain Assessment: 0-10 Pain Score: 2  Pain Location: R abdomen/L incision site Pain Descriptors / Indicators: Tender, Sore, Grimacing, Guarding, Discomfort Pain Intervention(s): Limited activity within patient's tolerance, Monitored during session, Repositioned Pre-ambulation pt's HR 75 bpm and SpO2 sats 93% on room air.    Home Living                          Prior Function            PT Goals (current goals can now be found in the care plan section) Acute Rehab PT Goals Patient Stated Goal: Get up and moving PT Goal Formulation: With patient Time For Goal Achievement: 07/21/23 Potential to Achieve Goals: Fair Progress towards PT goals: Progressing toward goals    Frequency    Min 3X/week      PT Plan      Co-evaluation              AM-PAC PT 6 Clicks Mobility   Outcome Measure  Help needed turning from your back to your side while in a flat bed without using bedrails?: A Little   Help needed moving to and from a bed to a chair (including a wheelchair)?: A Little Help needed standing up from a chair using your arms (e.g., wheelchair or bedside chair)?: A Little Help needed to walk in hospital room?: A Little Help needed climbing 3-5 steps with a railing? : A Lot 6 Click Score: 14    End of Session Equipment Utilized During Treatment: Gait belt (up high away from abdominal incision) Activity Tolerance: Patient tolerated treatment well;Patient limited by fatigue Patient left: in chair;with call bell/phone within reach;with chair alarm set Nurse Communication: Precautions;Other (comment) (pt's dizziness c/o's (and pt's BP end of session)) PT Visit Diagnosis: Unsteadiness on feet  (R26.81);Muscle weakness (generalized) (M62.81);Dizziness and giddiness (R42)     Time: 6295-2841 PT Time Calculation (min) (ACUTE ONLY): 44 min  Charges:    $Gait Training: 8-22 mins $Therapeutic Activity: 23-37 mins PT General Charges $$ ACUTE PT VISIT: 1 Visit                     Amador Junes, PT 07/08/23, 10:48 AM

## 2023-07-10 NOTE — Discharge Summary (Signed)
 Physician Discharge Summary   Patient: [name] MRN: [id number]  DOB: July 28, 1955   Admit:     Date of Admission: 07/05/2023 Admitted from: home   Discharge: Date of discharge: 07/08/2023 Disposition: Home Condition at discharge: good  CODE STATUS: FULL CODE     Discharge Physician: Melodi Sprung, DO [facility]     PCP: Lyle San, MD  Recommendations for Outpatient Follow-up:  Follow up with PCP Lyle San, MD and general surgery Needs CBC    Discharge Instructions     Call MD for:  difficulty breathing, headache or visual disturbances   Complete by: As directed    Call MD for:  extreme fatigue   Complete by: As directed    Call MD for:  hives   Complete by: As directed    Call MD for:  persistant dizziness or light-headedness   Complete by: As directed    Call MD for:  persistant nausea and vomiting   Complete by: As directed    Call MD for:  redness, tenderness, or signs of infection (pain, swelling, redness, odor or green/yellow discharge around incision site)   Complete by: As directed    Call MD for:  severe uncontrolled pain   Complete by: As directed    Call MD for:  temperature >100.4   Complete by: As directed    Diet - low sodium heart healthy   Complete by: As directed    Discharge instructions   Complete by: As directed    Empty JP BID, please teach pt about JP care   Increase activity slowly   Complete by: As directed    Lifting restrictions   Complete by: As directed    20 lbs x 6 wks         Discharge Diagnoses: Principal Problem:   Acute perforated appendicitis Active Problems:   Type 2A von Willebrand's disease (HCC)   Hypothyroidism   Acute appendicitis   Dyslipidemia       Hospital course / significant events:   HPI: [name] is a 68 y.o. female with medical history significant for type 2 diabetes mellitus, Von Willebrand disease type IIa and essential hypertension, who presented to  the emergency room with acute onset of lower abdominal pain started on Monday morning with associated fever and chills.  She has been having anorexia with nausea without vomiting.  She has not had any bowel movements since her pain started.   06/11: CT w/ acute appendicitis with appendicolith noted. There was no definite abscess. Also hepatic cirrhosis, cholelithiasis and aortic atherosclerosis. WBC 13.7, Plt 83. Started on Zosyn . Underwent appendectomy. Hospitalist consulted for medical management  06/12: WBC improving, no concerns on BMP. Plt lower today at 62 will monitor closely 06/13: Plt stable, pain improved, mobilizing w/ therapy continuing Zosyn  06/14: Plt lower today but no bleeding. Will need close outpatient follow up for CBC check surgery team ok for dc home     ASSESSMENT & PLAN:   Appendicitis - s/p appendectomy  Per general surgery team   Type 2 diabetes mellitus without complications. Resume home medications  Hypothyroidism. continue Synthroid .   Dyslipidemia. continue statin    Thrombocytopenia likely chronic assoc w von willebrand +/- cirrhosis, and likely worsened w/ acute inflammation  Monitor CBC - will need close outpatient follow up  Transfuse if bleeding   Von Willebrand's disease continue tranexamic acid .   GERD without esophagitis. continue Carafate .   History of breast cancer. conitnue Tamoxifen   Class 2 obesity based on BMI: Body mass index is 39.06 kg/m.[name] Significantly low or high BMI is associated with higher medical risk.  Underweight - under 18  overweight - 25 to 29 obese - 30 or more Class 1 obesity: BMI of 30.0 to 34 Class 2 obesity: BMI of 35.0 to 39 Class 3 obesity: BMI of 40.0 to 49 Super Morbid Obesity: BMI 50-59 Super-super Morbid Obesity: BMI 60+ Healthy nutrition and physical activity advised as adjunct to other disease management and risk reduction treatments            Discharge Instructions  Allergies as of  07/08/2023       Reactions   Aspirin Other (See Comments)   Von Willebrand's disease Contraindication due to bleeding disorder Von Willebrand's disease   Desmopressin Other (See Comments)   with injectable ddavp Bad headache with injectable ddavp with injectable ddavp   Ibuprofen Other (See Comments)   Von Willebrand's disease NSAIDS contraindicated with bleeding disorder. Von Willebrand's disease   Other Other (See Comments)   Von Willebrand's disease  NSAIDS contraindicated with bleeding disorder. Other reaction(s): Other (See Comments)  Von Willebrand's disease Von Willebrand's disease  NSAIDS contraindicated with bleeding disorder.  Other reaction(s): Other (See Comments)  Von Willebrand's disease        Medication List     TAKE these medications    ACETAMINOPHEN  8 HOUR PO Take 1 tablet by mouth daily.   amoxicillin-clavulanate 875-125 MG tablet Commonly known as: AUGMENTIN Take 1 tablet by mouth 2 (two) times daily.   bimatoprost 0.01 % Soln Commonly known as: LUMIGAN Apply to eye.   brimonidine  0.2 % ophthalmic solution Commonly known as: ALPHAGAN  Apply to eye.   dorzolamide -timolol  2-0.5 % ophthalmic solution Commonly known as: COSOPT  Apply to eye.   furosemide 20 MG tablet Commonly known as: LASIX Take 20 mg by mouth daily.   glimepiride 4 MG tablet Commonly known as: AMARYL Take by mouth.   Humate-P [phone] units Solr injection Generic drug: antihemophilic factor-vwf Infuse Humate P 4130 RCOunits (+/-10%) IVP 3x weekly on M-W-F and Q12-24 hours prn bleeding as directed   levothyroxine  125 MCG tablet Commonly known as: SYNTHROID  Take by mouth.   meclizine 25 MG tablet Commonly known as: ANTIVERT Take 1 tablet by mouth every 6 (six) hours as needed.   ondansetron  4 MG tablet Commonly known as: ZOFRAN  Take by mouth.   oxyCODONE -acetaminophen  5-325 MG tablet Commonly known as: PERCOCET/ROXICET Take 1-2 tablets by mouth every 4 (four)  hours as needed for moderate pain (pain score 4-6).   pioglitazone-metformin 15-500 MG tablet Commonly known as: ACTOPLUS MET Take by mouth.   Precision QID Test test strip Generic drug: glucose blood Use 2 (two) times daily. Use as instructed.   simvastatin  40 MG tablet Commonly known as: ZOCOR  Take by mouth.   sucralfate  1 g tablet Commonly known as: CARAFATE  TAKE ONE TABLET BY MOUTH FOUR TIMES A DAY BEFORE MEALS AND NIGHTLY   tamoxifen  20 MG tablet Commonly known as: NOLVADEX  Take 1 tablet by mouth daily.   tranexamic acid  650 MG Tabs tablet Commonly known as: LYSTEDA  Take 1,300 mg by mouth 3 (three) times daily.         Follow-up Information     Flynn Hylan, MD. Go on 07/13/2023.   Specialty: General Surgery Why: Go to appointment on 06/19 at 215 PM Contact information: [address] [place] [address] [phone]  Allergies  Allergen Reactions   Aspirin Other (See Comments)    Von Willebrand's disease Contraindication due to bleeding disorder Von Willebrand's disease    Desmopressin Other (See Comments)    with injectable ddavp Bad headache with injectable ddavp with injectable ddavp    Ibuprofen Other (See Comments)    Von Willebrand's disease NSAIDS contraindicated with bleeding disorder.  Von Willebrand's disease    Other Other (See Comments)    Von Willebrand's disease   NSAIDS contraindicated with bleeding disorder.  Other reaction(s): Other (See Comments)   Von Willebrand's disease  Von Willebrand's disease  NSAIDS contraindicated with bleeding disorder.  Other reaction(s): Other (See Comments)  Von Willebrand's disease     Subjective: pt feeling well this morning, no comcplaints pain controlled    Discharge Exam: BP (!) 131/51 (BP Location: Left Arm)   Pulse 72   Temp 97.9 F (36.6 C) (Oral)   Resp 17   Ht 5' 6 (1.676 m)   Wt 109.8 kg   SpO2 90%   BMI 39.06 kg/m  General: Pt  is alert, awake, not in acute distress Cardiovascular: RRR, S1/S2 +, no rubs, no gallops Respiratory: CTA bilaterally, no wheezing, no rhonchi Abdominal: Soft, NT, ND, bowel sounds + Extremities: no edema, no cyanosis     The results of significant diagnostics from this hospitalization (including imaging, microbiology, ancillary and laboratory) are listed below for reference.     Microbiology: No results found for this or any previous visit (from the past 240 hours).   Labs: BNP (last 3 results) No results for input(s): BNP in the last 8760 hours. Basic Metabolic Panel: Recent Labs  Lab 07/05/23 1229 07/06/23 0545 07/07/23 0514 07/08/23 2138  NA 138 138 137 136  K 4.1 3.8 3.8 4.0  CL 102 104 102 99  CO2 26 26 27 25   GLUCOSE 159* 176* 137* 133*  BUN 12 13 18 16   CREATININE 0.77 0.87 0.84 0.76  CALCIUM 9.0 7.9* 8.3* 8.4*   Liver Function Tests: Recent Labs  Lab 07/05/23 1229 07/08/23 2138  AST 15 16  ALT 11 8  ALKPHOS 59 52  BILITOT 1.0 1.0  PROT 7.6 6.6  ALBUMIN 3.4* 2.7*   Recent Labs  Lab 07/05/23 1229  LIPASE 27   No results for input(s): AMMONIA in the last 168 hours. CBC: Recent Labs  Lab 07/05/23 1229 07/06/23 0545 07/07/23 0514 07/08/23 0424 07/08/23 2138  WBC 13.7* 11.4* 11.3* 10.2 9.9  HGB 14.4 13.0 12.4 12.7 12.3  HCT 43.8 39.7 38.3 39.4 39.5  MCV 95.0 95.9 97.7 97.0 98.0  PLT 83* 62* 64* 46* 68*   Cardiac Enzymes: No results for input(s): CKTOTAL, CKMB, CKMBINDEX, TROPONINI in the last 168 hours. BNP: Invalid input(s): POCBNP CBG: Recent Labs  Lab 07/07/23 1117 07/07/23 1554 07/07/23 2135 07/08/23 0828 07/08/23 1150  GLUCAP 129* 141* 110* 128* 205*   D-Dimer No results for input(s): DDIMER in the last 72 hours. Hgb A1c No results for input(s): HGBA1C in the last 72 hours. Lipid Profile No results for input(s): CHOL, HDL, LDLCALC, TRIG, CHOLHDL, LDLDIRECT in the last 72 hours. Thyroid function  studies No results for input(s): TSH, T4TOTAL, T3FREE, THYROIDAB in the last 72 hours.  Invalid input(s): FREET3 Anemia work up No results for input(s): VITAMINB12, FOLATE, FERRITIN, TIBC, IRON, RETICCTPCT in the last 72 hours. Urinalysis    Component Value Date/Time   COLORURINE YELLOW (A) 07/05/2023 1229   APPEARANCEUR HAZY (A) 07/05/2023 1229   APPEARANCEUR Clear  06/17/2011 1630   LABSPEC 1.024 07/05/2023 1229   LABSPEC 1.019 06/17/2011 1630   PHURINE 5.0 07/05/2023 1229   GLUCOSEU NEGATIVE 07/05/2023 1229   GLUCOSEU Negative 06/17/2011 1630   HGBUR MODERATE (A) 07/05/2023 1229   BILIRUBINUR NEGATIVE 07/05/2023 1229   BILIRUBINUR Negative 06/17/2011 1630   KETONESUR NEGATIVE 07/05/2023 1229   PROTEINUR 30 (A) 07/05/2023 1229   NITRITE NEGATIVE 07/05/2023 1229   LEUKOCYTESUR TRACE (A) 07/05/2023 1229   LEUKOCYTESUR Negative 06/17/2011 1630   Sepsis Labs Recent Labs  Lab 07/06/23 0545 07/07/23 0514 07/08/23 0424 07/08/23 2138  WBC 11.4* 11.3* 10.2 9.9   Microbiology No results found for this or any previous visit (from the past 240 hours). Imaging No results found.    SIGNED:  Danijela Vessey DO [facility]

## 2023-07-13 ENCOUNTER — Encounter: Payer: Self-pay | Admitting: Surgery

## 2023-07-13 ENCOUNTER — Ambulatory Visit (INDEPENDENT_AMBULATORY_CARE_PROVIDER_SITE_OTHER): Admitting: Surgery

## 2023-07-13 VITALS — BP 147/82 | HR 71 | Temp 98.8°F | Ht 66.0 in | Wt 243.2 lb

## 2023-07-13 DIAGNOSIS — K3532 Acute appendicitis with perforation and localized peritonitis, without abscess: Secondary | ICD-10-CM

## 2023-07-13 DIAGNOSIS — Z9049 Acquired absence of other specified parts of digestive tract: Secondary | ICD-10-CM

## 2023-07-13 DIAGNOSIS — Z09 Encounter for follow-up examination after completed treatment for conditions other than malignant neoplasm: Secondary | ICD-10-CM

## 2023-07-13 NOTE — Progress Notes (Signed)
 [facility] POST-OP OFFICE VISIT  07/13/2023  HPI: [name] is a 68 y.o. female had surgery on July 05, 2023, now s/p robotic appy for perforated appendicitis.  Reports expected post-op discomforts, tolerating diet w/o n/v.  Denies f/c, diarrhea and constipation.   Vital signs: BP (!) 147/82   Pulse 71   Temp 98.8 F (37.1 C) (Oral)   Ht 5' 6 (1.676 m)   Wt 243 lb 3.2 oz (110.3 kg)   SpO2 97%   BMI 39.25 kg/m    Physical Exam: Constitutional: appears well at baseling.  Abdomen: soft without unexpected tenderness, no guarding or rebound, soft.  JP with SS fluid in bulb, and dressing wet with serous fluid.   Drain removed, and I left one nylon suture at incision.  Skin: No erythema, or induraion, incisions are c/d/I.   Assessment/Plan: This is a 68 y.o. female as above.    Patient Active Problem List   Diagnosis Date Noted   Acute perforated appendicitis 07/05/2023   Type 2A von Willebrand's disease (HCC) 07/05/2023   Hypothyroidism 07/05/2023   Acute appendicitis 07/05/2023   Dyslipidemia 07/05/2023   Pain and swelling of lower leg 06/22/2020   Lymphedema 06/22/2020   Essential hypertension 06/22/2020   Hyperlipidemia 06/22/2020   Diabetes (HCC) 06/22/2020    - Drain removed.  Advised in dressing changes and wound care.  Will have her back in one week for suture removal.  Complete abx.    Flynn Hylan M.D., FACS 07/13/2023, 2:19 PM

## 2023-07-13 NOTE — Patient Instructions (Signed)

## 2023-07-14 DIAGNOSIS — Z9049 Acquired absence of other specified parts of digestive tract: Secondary | ICD-10-CM | POA: Insufficient documentation

## 2023-07-20 ENCOUNTER — Encounter: Payer: Self-pay | Admitting: Surgery

## 2023-07-20 ENCOUNTER — Ambulatory Visit (INDEPENDENT_AMBULATORY_CARE_PROVIDER_SITE_OTHER): Admitting: Surgery

## 2023-07-20 VITALS — BP 140/80 | HR 74 | Ht 66.0 in | Wt 237.0 lb

## 2023-07-20 DIAGNOSIS — Z9049 Acquired absence of other specified parts of digestive tract: Secondary | ICD-10-CM

## 2023-07-20 DIAGNOSIS — Z09 Encounter for follow-up examination after completed treatment for conditions other than malignant neoplasm: Secondary | ICD-10-CM

## 2023-07-20 DIAGNOSIS — K3532 Acute appendicitis with perforation and localized peritonitis, without abscess: Secondary | ICD-10-CM

## 2023-07-20 NOTE — Patient Instructions (Addendum)
 Follow-up with our office as needed. ? ?Please call and ask to speak with a nurse if you develop questions or concerns. ? ? ?GENERAL POST-OPERATIVE ?PATIENT INSTRUCTIONS  ? ?WOUND CARE INSTRUCTIONS:  Keep a dry clean dressing on the wound if there is drainage. The initial bandage may be removed after 24 hours.  Once the wound has quit draining you may leave it open to air.  If clothing rubs against the wound or causes irritation and the wound is not draining you may cover it with a dry dressing during the daytime.  Try to keep the wound dry and avoid ointments on the wound unless directed to do so.  If the wound becomes bright red and painful or starts to drain infected material that is not clear, please contact your physician immediately.  If the wound is mildly pink and has a thick firm ridge underneath it, this is normal, and is referred to as a healing ridge.  This will resolve over the next 4-6 weeks. ? ?BATHING: ?You may shower if you have been informed of this by your surgeon. However, Please do not submerge in a tub, hot tub, or pool until incisions are completely sealed or have been told by your surgeon that you may do so. ? ?DIET:  You may eat any foods that you can tolerate.  It is a good idea to eat a high fiber diet and take in plenty of fluids to prevent constipation.  If you do become constipated you may want to take a mild laxative or take ducolax tablets on a daily basis until your bowel habits are regular.  Constipation can be very uncomfortable, along with straining, after recent surgery. ? ?ACTIVITY:  You are encouraged to cough and deep breath or use your incentive spirometer if you were given one, every 15-30 minutes when awake.  This will help prevent respiratory complications and low grade fevers post-operatively if you had a general anesthetic.  You may want to hug a pillow when coughing and sneezing to add additional support to the surgical area, if you had abdominal or chest surgery, which  will decrease pain during these times.  You are encouraged to walk and engage in light activity for the next two weeks.  You should not lift more than 20 pounds for 6 weeks total after surgery as it could put you at increased risk for complications.  Twenty pounds is roughly equivalent to a plastic bag of groceries. At that time- Listen to your body when lifting, if you have pain when lifting, stop and then try again in a few days. Soreness after doing exercises or activities of daily living is normal as you get back in to your normal routine. ? ?MEDICATIONS:  Try to take narcotic medications and anti-inflammatory medications, such as tylenol, ibuprofen, naprosyn, etc., with food.  This will minimize stomach upset from the medication.  Should you develop nausea and vomiting from the pain medication, or develop a rash, please discontinue the medication and contact your physician.  You should not drive, make important decisions, or operate machinery when taking narcotic pain medication. ? ?SUNBLOCK ?Use sun block to incision area over the next year if this area will be exposed to sun. This helps decrease scarring and will allow you avoid a permanent darkened area over your incision. ? ?QUESTIONS:  Please feel free to call our office if you have any questions, and we will be glad to assist you. [phone] ? ? ?

## 2023-07-20 NOTE — Progress Notes (Signed)
 [facility] POST-OP OFFICE VISIT  07/20/2023  HPI: [name] is a 68 y.o. female had surgery on July 05, 2023, now s/p robotic appy for perforated appendicitis.  Reports expected post-op discomforts, tolerating diet w/o n/v.  Denies f/c, diarrhea and constipation.   Notes her bowel movements are slightly colored and ribbonlike and texture.  Senses it some degree of pulling over her lower abdomen but otherwise no issues with her surgical site or right lower quadrant.  Vital signs: BP (!) 140/80   Pulse 74   Ht 5' 6 (1.676 m)   Wt 237 lb (107.5 kg)   SpO2 98%   BMI 38.25 kg/m    Physical Exam: Constitutional: appears well at baseling.  Abdomen: soft without tenderness, no guarding or rebound, soft.   Removed, one nylon suture at incision.  Skin: No erythema, or induration, incisions are c/d/I.   Assessment/Plan: This is a 68 y.o. female as above.    Patient Active Problem List   Diagnosis Date Noted   S/P laparoscopic appendectomy 07/14/2023   Acute perforated appendicitis 07/05/2023   Type 2A von Willebrand's disease (HCC) 07/05/2023   Hypothyroidism 07/05/2023   Acute appendicitis 07/05/2023   Dyslipidemia 07/05/2023   Pain and swelling of lower leg 06/22/2020   Lymphedema 06/22/2020   Essential hypertension 06/22/2020   Hyperlipidemia 06/22/2020   Diabetes (HCC) 06/22/2020    - Remaining suture removed.  May follow-up as needed  Honor Leghorn M.D., [facility] 07/20/2023, 1:38 PM

## 2024-02-16 ENCOUNTER — Encounter
Admission: RE | Disposition: A | Discharge status: Home / Self Care | Payer: Self-pay | Source: Home / Self Care | Attending: Gastroenterology

## 2024-02-16 ENCOUNTER — Encounter: Payer: Self-pay | Admitting: Gastroenterology

## 2024-02-16 ENCOUNTER — Ambulatory Visit
Admission: RE | Admit: 2024-02-16 | Discharge: 2024-02-16 | Disposition: A | Discharge status: Home / Self Care | Attending: Gastroenterology | Admitting: Gastroenterology

## 2024-02-16 ENCOUNTER — Ambulatory Visit

## 2024-02-16 DIAGNOSIS — I10 Essential (primary) hypertension: Secondary | ICD-10-CM | POA: Insufficient documentation

## 2024-02-16 DIAGNOSIS — K219 Gastro-esophageal reflux disease without esophagitis: Secondary | ICD-10-CM | POA: Diagnosis not present

## 2024-02-16 DIAGNOSIS — D509 Iron deficiency anemia, unspecified: Secondary | ICD-10-CM | POA: Insufficient documentation

## 2024-02-16 DIAGNOSIS — Z8719 Personal history of other diseases of the digestive system: Secondary | ICD-10-CM | POA: Diagnosis not present

## 2024-02-16 DIAGNOSIS — D121 Benign neoplasm of appendix: Secondary | ICD-10-CM | POA: Insufficient documentation

## 2024-02-16 DIAGNOSIS — K640 First degree hemorrhoids: Secondary | ICD-10-CM | POA: Diagnosis not present

## 2024-02-16 DIAGNOSIS — K2270 Barrett's esophagus without dysplasia: Secondary | ICD-10-CM | POA: Diagnosis not present

## 2024-02-16 DIAGNOSIS — E119 Type 2 diabetes mellitus without complications: Secondary | ICD-10-CM | POA: Diagnosis not present

## 2024-02-16 DIAGNOSIS — K2960 Other gastritis without bleeding: Secondary | ICD-10-CM | POA: Diagnosis not present

## 2024-02-16 DIAGNOSIS — K5289 Other specified noninfective gastroenteritis and colitis: Secondary | ICD-10-CM | POA: Diagnosis not present

## 2024-02-16 DIAGNOSIS — Z7984 Long term (current) use of oral hypoglycemic drugs: Secondary | ICD-10-CM | POA: Diagnosis not present

## 2024-02-16 DIAGNOSIS — D122 Benign neoplasm of ascending colon: Secondary | ICD-10-CM | POA: Diagnosis not present

## 2024-02-16 DIAGNOSIS — E039 Hypothyroidism, unspecified: Secondary | ICD-10-CM | POA: Insufficient documentation

## 2024-02-16 LAB — GLUCOSE, CAPILLARY: Glucose-Capillary: 87 mg/dL (ref 70–99)

## 2024-02-16 MED ORDER — LIDOCAINE HCL (PF) 2 % IJ SOLN
INTRAMUSCULAR | Status: DC | PRN
  Administered 2024-02-16: 100 mg via INTRADERMAL

## 2024-02-16 MED ORDER — PROPOFOL 500 MG/50ML IV EMUL
INTRAVENOUS | Status: DC | PRN
  Administered 2024-02-16: 120 mg via INTRAVENOUS
  Administered 2024-02-16: 200 ug/kg/min via INTRAVENOUS

## 2024-02-16 MED ORDER — SODIUM CHLORIDE 0.9 % IV SOLN
INTRAVENOUS | Status: DC
  Administered 2024-02-16: 20 mL/h via INTRAVENOUS

## 2024-02-16 NOTE — H&P (Signed)
 "  Pre-Procedure H&P   Patient ID: SYMPHANIE CEDERBERG is a 69 y.o. female.  Gastroenterology Provider: Elspeth Ozell Jungling, DO  Referring Provider: Romero Antigua, PA PCP: Valora Lynwood FALCON, MD  Date: 02/16/2024  HPI Ms. OLUWASEUN BRUYERE is a 69 y.o. female who presents today for Esophagogastroduodenoscopy and Colonoscopy for Iron deficiency anemia .  Pt took her humate today.  Last underwent EGD colonoscopy and video capsule endoscopy in 2020 with GI bleeding episode.  She has a history of advanced polyp in 2010.  History of small bowel AVMs  Colonoscopy in 2020 with internal hemorrhoids normal TI and normal colon.  Esophagogastroduodenoscopy/enteroscopy with 1 AVM bleeding treated with APC, small hiatal hernia and normal jejunum.  Gastritis was noted as well  Hemoglobin 9.4 MCV 93 platelets 1-54,000 iron saturation 14% ferritin 17 creatinine 0.77   Past Medical History:  Diagnosis Date   Acute perforated appendicitis 07/05/2023   Breast cancer (HCC)    Cancer (HCC)    skin ca   Diabetes mellitus without complication (HCC)    Glaucoma    Hypertension    Renal disorder    Von Willebrand disease, type IIa (HCC)     Past Surgical History:  Procedure Laterality Date   BREAST BIOPSY Right 01/12/2016   path pending/stereo affirm   OOPHORECTOMY Left    XI ROBOTIC LAPAROSCOPIC ASSISTED APPENDECTOMY N/A 07/05/2023   Procedure: APPENDECTOMY, ROBOT-ASSISTED, LAPAROSCOPIC;  Surgeon: Lane Shope, MD;  Location: [facility] ORS;  Service: General;  Laterality: N/A;    Family History No h/o GI disease or malignancy  Review of Systems  Constitutional:  Negative for activity change, appetite change, chills, diaphoresis, fatigue, fever and unexpected weight change.  HENT:  Negative for trouble swallowing and voice change.   Respiratory:  Negative for shortness of breath and wheezing.   Cardiovascular:  Negative for chest pain, palpitations and leg swelling.  Gastrointestinal:  Negative  for abdominal distention, abdominal pain, anal bleeding, blood in stool, constipation, diarrhea, nausea, rectal pain and vomiting.  Musculoskeletal:  Negative for arthralgias and myalgias.  Skin:  Negative for color change and pallor.  Neurological:  Negative for dizziness, syncope and weakness.  Psychiatric/Behavioral:  Negative for confusion.   All other systems reviewed and are negative.    Medications Medications Ordered Prior to Encounter[1]  Pertinent medications related to GI and procedure were reviewed by me with the patient prior to the procedure  Current Medications[2]  sodium chloride          Allergies[3] Allergies were reviewed by me prior to the procedure  Objective   Body mass index is 38.7 kg/m. Vitals:   02/16/24 0834  BP: (!) 152/89  Pulse: 77  Resp: 20  Temp: (!) 96.5 F (35.8 C)  TempSrc: Temporal  SpO2: 98%  Weight: 108.8 kg  Height: 5' 6 (1.676 m)     Physical Exam Vitals and nursing note reviewed.  Constitutional:      General: She is not in acute distress.    Appearance: Normal appearance. She is obese. She is not ill-appearing, toxic-appearing or diaphoretic.  HENT:     Head: Normocephalic and atraumatic.     Nose: Nose normal.     Mouth/Throat:     Mouth: Mucous membranes are moist.     Pharynx: Oropharynx is clear.  Eyes:     General: No scleral icterus.    Extraocular Movements: Extraocular movements intact.  Cardiovascular:     Rate and Rhythm: Normal rate and regular rhythm.  Heart sounds: Normal heart sounds. No murmur heard.    No friction rub. No gallop.  Pulmonary:     Effort: Pulmonary effort is normal. No respiratory distress.     Breath sounds: Normal breath sounds. No wheezing, rhonchi or rales.  Abdominal:     General: Bowel sounds are normal. There is no distension.     Palpations: Abdomen is soft.     Tenderness: There is no abdominal tenderness. There is no guarding or rebound.  Musculoskeletal:     Cervical  back: Neck supple.     Right lower leg: No edema.     Left lower leg: No edema.  Skin:    General: Skin is warm and dry.     Coloration: Skin is not jaundiced or pale.  Neurological:     General: No focal deficit present.     Mental Status: She is alert and oriented to person, place, and time. Mental status is at baseline.  Psychiatric:        Mood and Affect: Mood normal.        Behavior: Behavior normal.        Thought Content: Thought content normal.        Judgment: Judgment normal.      Assessment:  Ms. LILAH MIJANGOS is a 69 y.o. female  who presents today for Esophagogastroduodenoscopy and Colonoscopy for Iron deficiency anemia .  Plan:  Esophagogastroduodenoscopy and Colonoscopy with possible intervention today  Esophagogastroduodenoscopy and Colonoscopy with possible biopsy, control of bleeding, polypectomy, and interventions as necessary has been discussed with the patient/patient representative. Informed consent was obtained from the patient/patient representative after explaining the indication, nature, and risks of the procedure including but not limited to death, bleeding, perforation, missed neoplasm/lesions, cardiorespiratory compromise, and reaction to medications. Opportunity for questions was given and appropriate answers were provided. Patient/patient representative has verbalized understanding is amenable to undergoing the procedure.   Elspeth Ozell Jungling, DO  [facility]  Portions of the record may have been created with voice recognition software. Occasional wrong-word or 'sound-a-like' substitutions may have occurred due to the inherent limitations of voice recognition software.  Read the chart carefully and recognize, using context, where substitutions may have occurred.     [1]  No current facility-administered medications on file prior to encounter.   Current Outpatient Medications on File Prior to Encounter  Medication Sig  Dispense Refill   ACETAMINOPHEN  8 HOUR PO Take 1 tablet by mouth daily.     amoxicillin -clavulanate (AUGMENTIN ) 875-125 MG tablet Take 1 tablet by mouth 2 (two) times daily. 14 tablet 0   antihemophilic factor-vwf (HUMATE-P) [id number] units SOLR injection Infuse Humate P 4130 RCOunits (+/-10%) IVP 3x weekly on M-W-F and Q12-24 hours prn bleeding as directed     bimatoprost (LUMIGAN) 0.01 % SOLN Apply to eye.     brimonidine  (ALPHAGAN ) 0.2 % ophthalmic solution Apply to eye.     dorzolamide -timolol  (COSOPT ) 22.3-6.8 MG/ML ophthalmic solution Apply to eye.     glimepiride (AMARYL) 4 MG tablet Take by mouth. (Patient taking differently: Take 1 mg by mouth 2 (two) times daily.)     glucose blood (PRECISION QID TEST) test strip Use 2 (two) times daily. Use as instructed.     levothyroxine  (SYNTHROID ) 100 MCG tablet Take 100 mcg by mouth daily before breakfast.     meclizine (ANTIVERT) 25 MG tablet Take 1 tablet by mouth every 6 (six) hours as needed.     ondansetron  (ZOFRAN ) 4 MG tablet  Take by mouth.     pioglitazone-metformin (ACTOPLUS MET) 15-500 MG tablet Take by mouth.     simvastatin  (ZOCOR ) 40 MG tablet Take by mouth.     tranexamic acid  (LYSTEDA ) 650 MG TABS tablet Take 1,300 mg by mouth 3 (three) times daily.    [2]  Current Facility-Administered Medications:    0.9 %  sodium chloride  infusion, , Intravenous, Continuous, Onita Elspeth Sharper, DO [3]  Allergies Allergen Reactions   Aspirin Other (See Comments)    Von Willebrand's disease Contraindication due to bleeding disorder Von Willebrand's disease    Desmopressin Other (See Comments)    with injectable ddavp Bad headache with injectable ddavp with injectable ddavp    Ibuprofen Other (See Comments)    Von Willebrand's disease NSAIDS contraindicated with bleeding disorder.  Von Willebrand's disease    Other Other (See Comments)    Von Willebrand's disease   NSAIDS contraindicated with bleeding disorder.  Other  reaction(s): Other (See Comments)   Von Willebrand's disease  Von Willebrand's disease  NSAIDS contraindicated with bleeding disorder.  Other reaction(s): Other (See Comments)  Von Willebrand's disease   "

## 2024-02-16 NOTE — Transfer of Care (Signed)
 Immediate Anesthesia Transfer of Care Note  Patient: [name]  Procedure(s) Performed: COLONOSCOPY EGD (ESOPHAGOGASTRODUODENOSCOPY)  Patient Location: Endoscopy Unit  Anesthesia Type:General  Level of Consciousness: awake  Airway & Oxygen Therapy: Patient Spontanous Breathing  Post-op Assessment: Report given to RN and Post -op Vital signs reviewed and stable  Post vital signs: Reviewed and stable  Last Vitals:  Vitals Value Taken Time  BP 119/60 02/16/24 09:36  Temp    Pulse 58 02/16/24 09:36  Resp 21 02/16/24 09:36  SpO2 99 % 02/16/24 09:36  Vitals shown include unfiled device data.  Last Pain:  Vitals:   02/16/24 0936  TempSrc:   PainSc: Asleep         Complications: There were no known notable events for this encounter.

## 2024-02-16 NOTE — Op Note (Signed)
 [facility] Patient Name: [name] Procedure Date: 02/16/2024 8:48 AM MRN: [id number] Account #: [id number] Date of Birth: 1955-07-07 Admit Type: Outpatient Age: 69 Room: [facility] ENDO ROOM 1 Gender: Female Note Status: Finalized Instrument Name: Upper GI Scope [id number] Procedure:             Upper GI endoscopy Indications:           Iron deficiency anemia Providers:             Elspeth Ozell Onita ROSALEA, DO Referring MD:          Lynwood FALCON. Valora, MD (Referring MD) Medicines:             Monitored Anesthesia Care Complications:         No immediate complications. Estimated blood loss:                         Minimal. Procedure:             Pre-Anesthesia Assessment:                        - Prior to the procedure, a History and Physical was                         performed, and patient medications and allergies were                         reviewed. The patient is competent. The risks and                         benefits of the procedure and the sedation options and                         risks were discussed with the patient. All questions                         were answered and informed consent was obtained.                         Patient identification and proposed procedure were                         verified by the physician, the nurse, the anesthetist                         and the technician in the endoscopy suite. Mental                         Status Examination: alert and oriented. Airway                         Examination: normal oropharyngeal airway and neck                         mobility. Respiratory Examination: clear to                         auscultation. CV Examination: RRR, no murmurs, no S3  or S4. Prophylactic Antibiotics: The patient does not                         require prophylactic antibiotics. Prior                         Anticoagulants: The patient has taken no anticoagulant                          or antiplatelet agents. ASA Grade Assessment: III - A                         patient with severe systemic disease. After reviewing                         the risks and benefits, the patient was deemed in                         satisfactory condition to undergo the procedure. The                         anesthesia plan was to use monitored anesthesia care                         (MAC). Immediately prior to administration of                         medications, the patient was re-assessed for adequacy                         to receive sedatives. The heart rate, respiratory                         rate, oxygen saturations, blood pressure, adequacy of                         pulmonary ventilation, and response to care were                         monitored throughout the procedure. The physical                         status of the patient was re-assessed after the                         procedure.                        After obtaining informed consent, the endoscope was                         passed under direct vision. Throughout the procedure,                         the patient's blood pressure, pulse, and oxygen                         saturations were monitored continuously. The Endoscope  was introduced through the mouth, and advanced to the                         third part of duodenum. The upper GI endoscopy was                         accomplished without difficulty. The patient tolerated                         the procedure well. Findings:      The duodenal bulb, first portion of the duodenum, second portion of the       duodenum and third portion of the duodenum were normal. Biopsies for       histology were taken with a cold forceps for evaluation of celiac       disease. Estimated blood loss was minimal.      Localized moderate inflammation characterized by erosions and erythema       was found in the gastric antrum. Biopsies were  taken with a cold forceps       for Helicobacter pylori testing. Estimated blood loss was minimal.      The gastroesophageal flap valve was visualized endoscopically and       classified as Hill Grade II (fold present, opens with respiration).       Estimated blood loss: none.      The exam of the stomach was otherwise normal.      Esophagogastric landmarks were identified: the gastroesophageal junction       was found at 38 cm from the incisors.      The Z-line was irregular. 1 cm tongue of salmon colored mucosa. Biopsies       were taken with a cold forceps for histology. Estimated blood loss was       minimal.      The exam of the esophagus was otherwise normal. Impression:            - Normal duodenal bulb, first portion of the duodenum,                         second portion of the duodenum and third portion of                         the duodenum. Biopsied.                        - Gastritis. Biopsied.                        - Gastroesophageal flap valve classified as Hill Grade                         II (fold present, opens with respiration).                        - Esophagogastric landmarks identified.                        - Z-line irregular. Biopsied. Recommendation:        - Patient has a contact number available for  emergencies. The signs and symptoms of potential                         delayed complications were discussed with the patient.                         Return to normal activities tomorrow. Written                         discharge instructions were provided to the patient.                        - Discharge patient to home.                        - Resume previous diet.                        - Continue present medications.                        - Await pathology results.                        - Repeat upper endoscopy for surveillance based on                         pathology results.                        - Consider ppi if not  already on.                        - The findings and recommendations were discussed with                         the patient. Procedure Code(s):     --- Professional ---                        [id number], Esophagogastroduodenoscopy, flexible,                         transoral; with biopsy, single or multiple Diagnosis Code(s):     --- Professional ---                        K29.70, Gastritis, unspecified, without bleeding                        K22.89, Other specified disease of esophagus                        D50.9, Iron deficiency anemia, unspecified CPT copyright 2022 American Medical Association. All rights reserved. The codes documented in this report are preliminary and upon coder review may  be revised to meet current compliance requirements. Attending Participation:      I personally performed the entire procedure. Elspeth Jungling, DO Elspeth Ozell Jungling DO, DO 02/16/2024 9:13:41 AM This report has been signed electronically. Number of Addenda: 0 Note Initiated On: 02/16/2024 8:48 AM Estimated Blood Loss:  Estimated blood loss was minimal.      [facility]

## 2024-02-16 NOTE — Interval H&P Note (Signed)
 History and Physical Interval Note: Preprocedure H&P from 02/16/24  was reviewed and there was no interval change after seeing and examining the patient.  Written consent was obtained from the patient after discussion of risks, benefits, and alternatives. Patient has consented to proceed with Esophagogastroduodenoscopy and Colonoscopy with possible intervention   02/16/2024 8:52 AM  [name]  has presented today for surgery, with the diagnosis of Iron deficiency anemia, unspecified iron deficiency anemia type (D50.9).  The various methods of treatment have been discussed with the patient and family. After consideration of risks, benefits and other options for treatment, the patient has consented to  Procedures: COLONOSCOPY (N/A) EGD (ESOPHAGOGASTRODUODENOSCOPY) (N/A) as a surgical intervention.  The patient's history has been reviewed, patient examined, no change in status, stable for surgery.  I have reviewed the patient's chart and labs.  Questions were answered to the patient's satisfaction.     Elspeth Ozell Jungling

## 2024-02-16 NOTE — Anesthesia Preprocedure Evaluation (Addendum)
"                                    Anesthesia Evaluation    Airway Mallampati: II  TM Distance: >3 FB Neck ROM: Full    Dental   Pulmonary    breath sounds clear to auscultation       Cardiovascular hypertension,  Rhythm:Regular     Neuro/Psych    GI/Hepatic   Endo/Other  diabetesHypothyroidism    Renal/GU      Musculoskeletal   Abdominal   Peds  Hematology   Anesthesia Other Findings   Reproductive/Obstetrics                              Anesthesia Physical Anesthesia Plan  ASA: 3  Anesthesia Plan: General   Post-op Pain Management:    Induction:   PONV Risk Score and Plan: TIVA and Propofol  infusion  Airway Management Planned: Natural Airway  Additional Equipment:   Intra-op Plan:   Post-operative Plan:   Informed Consent: I have reviewed the patients History and Physical, chart, labs and discussed the procedure including the risks, benefits and alternatives for the proposed anesthesia with the patient or authorized representative who has indicated his/her understanding and acceptance.       Plan Discussed with: CRNA  Anesthesia Plan Comments:          Anesthesia Quick Evaluation  "

## 2024-02-16 NOTE — Anesthesia Postprocedure Evaluation (Signed)
"   Anesthesia Post Note  Patient: [name]  Procedure(s) Performed: COLONOSCOPY EGD (ESOPHAGOGASTRODUODENOSCOPY)  Patient location during evaluation: PACU Anesthesia Type: General Level of consciousness: awake and alert Pain management: pain level controlled Vital Signs Assessment: post-procedure vital signs reviewed and stable Respiratory status: spontaneous breathing, nonlabored ventilation and respiratory function stable Cardiovascular status: blood pressure returned to baseline and stable Postop Assessment: no apparent nausea or vomiting Anesthetic complications: no   There were no known notable events for this encounter.   Last Vitals:  Vitals:   02/16/24 0946 02/16/24 0956  BP: (!) 132/110 (!) 151/60  Pulse:    Resp: 14 12  Temp:    SpO2: 99% 99%    Last Pain:  Vitals:   02/16/24 0956  TempSrc:   PainSc: 0-No pain                 Shau-Shau Melia      "

## 2024-02-20 LAB — SURGICAL PATHOLOGY
